# Patient Record
Sex: Female | Born: 1990 | Race: White | Hispanic: No | Smoking: Current every day smoker
Health system: Southern US, Community
[De-identification: ages and names within clinical notes are randomized; demographics above are authoritative.]

---

## 2016-12-06 ENCOUNTER — Emergency Department (HOSPITAL_COMMUNITY)
Admission: EM | Admit: 2016-12-06 | Discharge: 2016-12-06 | Disposition: A | Discharge status: Home / Self Care | Payer: Commercial Managed Care - PPO | Attending: Emergency Medicine | Admitting: Emergency Medicine

## 2016-12-06 ENCOUNTER — Encounter (HOSPITAL_COMMUNITY): Payer: Self-pay | Admitting: Emergency Medicine

## 2016-12-06 DIAGNOSIS — R1013 Epigastric pain: Secondary | ICD-10-CM | POA: Insufficient documentation

## 2016-12-06 DIAGNOSIS — F17210 Nicotine dependence, cigarettes, uncomplicated: Secondary | ICD-10-CM | POA: Insufficient documentation

## 2016-12-06 DIAGNOSIS — R112 Nausea with vomiting, unspecified: Secondary | ICD-10-CM | POA: Diagnosis not present

## 2016-12-06 LAB — URINALYSIS, ROUTINE W REFLEX MICROSCOPIC
Bilirubin Urine: NEGATIVE
GLUCOSE, UA: NEGATIVE mg/dL
HGB URINE DIPSTICK: NEGATIVE
Ketones, ur: NEGATIVE mg/dL
LEUKOCYTES UA: NEGATIVE
Nitrite: NEGATIVE
PROTEIN: NEGATIVE mg/dL
SPECIFIC GRAVITY, URINE: 1.019 (ref 1.005–1.030)
pH: 7 (ref 5.0–8.0)

## 2016-12-06 LAB — CBC WITH DIFFERENTIAL/PLATELET
BASOS PCT: 1 %
Basophils Absolute: 0 10*3/uL (ref 0.0–0.1)
Eosinophils Absolute: 0.1 10*3/uL (ref 0.0–0.7)
Eosinophils Relative: 2 %
HEMATOCRIT: 37.8 % (ref 36.0–46.0)
HEMOGLOBIN: 12.8 g/dL (ref 12.0–15.0)
LYMPHS PCT: 33 %
Lymphs Abs: 1.9 10*3/uL (ref 0.7–4.0)
MCH: 29.6 pg (ref 26.0–34.0)
MCHC: 33.9 g/dL (ref 30.0–36.0)
MCV: 87.5 fL (ref 78.0–100.0)
MONO ABS: 0.4 10*3/uL (ref 0.1–1.0)
Monocytes Relative: 7 %
NEUTROS ABS: 3.3 10*3/uL (ref 1.7–7.7)
NEUTROS PCT: 57 %
Platelets: 251 10*3/uL (ref 150–400)
RBC: 4.32 MIL/uL (ref 3.87–5.11)
RDW: 13 % (ref 11.5–15.5)
WBC: 5.7 10*3/uL (ref 4.0–10.5)

## 2016-12-06 LAB — COMPREHENSIVE METABOLIC PANEL
ALBUMIN: 4.2 g/dL (ref 3.5–5.0)
ALT: 19 U/L (ref 14–54)
AST: 18 U/L (ref 15–41)
Alkaline Phosphatase: 35 U/L — ABNORMAL LOW (ref 38–126)
Anion gap: 6 (ref 5–15)
BILIRUBIN TOTAL: 0.2 mg/dL — AB (ref 0.3–1.2)
BUN: 16 mg/dL (ref 6–20)
CALCIUM: 9.3 mg/dL (ref 8.9–10.3)
CO2: 25 mmol/L (ref 22–32)
CREATININE: 0.53 mg/dL (ref 0.44–1.00)
Chloride: 108 mmol/L (ref 101–111)
GFR calc Af Amer: >60 mL/min (ref >60)
GLUCOSE: 101 mg/dL — AB (ref 65–99)
Potassium: 4.1 mmol/L (ref 3.5–5.1)
Sodium: 139 mmol/L (ref 135–145)
TOTAL PROTEIN: 6.8 g/dL (ref 6.5–8.1)

## 2016-12-06 LAB — LIPASE, BLOOD: LIPASE: 23 U/L (ref 11–51)

## 2016-12-06 MED ORDER — SUCRALFATE 1 G PO TABS: 1.0000 g | ORAL_TABLET | Freq: Three times a day (TID) | ORAL | 0 refills | Status: DC

## 2016-12-06 MED ORDER — ONDANSETRON HCL 4 MG/2ML IJ SOLN
4.0000 mg | Freq: Once | INTRAMUSCULAR | Status: AC
  Administered 2016-12-06: 4 mg via INTRAVENOUS
  Filled 2016-12-06: qty 2

## 2016-12-06 MED ORDER — DICYCLOMINE HCL 10 MG PO CAPS
10.0000 mg | ORAL_CAPSULE | Freq: Once | ORAL | Status: AC
  Administered 2016-12-06: 10 mg via ORAL
  Filled 2016-12-06: qty 1

## 2016-12-06 MED ORDER — GI COCKTAIL ~~LOC~~
30.0000 mL | Freq: Once | ORAL | Status: AC
  Administered 2016-12-06: 30 mL via ORAL
  Filled 2016-12-06: qty 30

## 2016-12-06 MED ORDER — SODIUM CHLORIDE 0.9 % IV BOLUS (SEPSIS)
1000.0000 mL | Freq: Once | INTRAVENOUS | Status: AC
  Administered 2016-12-06: 1000 mL via INTRAVENOUS

## 2016-12-06 NOTE — ED Triage Notes (Signed)
Pt states that she has been having abdominal pain that goes up into her chest and is a burning sharp pain.  Pt states that she has also been nauseous and vomited once.  She has taken Zantac with no relief.

## 2016-12-06 NOTE — ED Notes (Signed)
-   PREGNANCY TEST NEGATIVE

## 2016-12-06 NOTE — Discharge Instructions (Addendum)
Follow-up with referred GI doctor. Call their office and arrange for an appointment to be seen next week.  Take medication as instructed for symptomatic relief.  Make sure to eat bland foods that will not irritate your reflux.  If you do not have a primary care doctor you can use the following list to obtain one.  [facility]  [facility]     [employer]                          [facility] [address] [place]                       [address]      [address]  [place]                                                [place]                            [place] Phone:  [phone]                                   Phone:  [phone]                 Phone:  [phone]  [facility] Phone:  [phone]  [facility] Hotline [phone]) [phone] (After Hours)

## 2016-12-06 NOTE — ED Provider Notes (Signed)
[facility]-EMERGENCY DEPT Provider Note   CSN: [id number] Arrival date & time: 12/06/16  0851     History   Chief Complaint Chief Complaint  Patient presents with  . Abdominal Pain    HPI Cindy Spears is a 26 y.o. female who presents with epigastric abdominal pain that began this morning at 4 AM. Patient describes pain as a "burning that goes up into my chest and throat." She states that pain is worsened by laying supine and improved if she is up. She took one Zantac this morning with no relief of symptoms. She states she has a history of indigestion but has not been formally diagnosed with GERD and is on any daily medication. She reports that last night she ate various by seafood and had a lot of caffeine which usually contributes to her indigestion. She reports associated nausea and vomiting began this morning. She reports 1 episode of nonbloody nonbilious emesis since onset of symptoms. Her last bowel movement was this morning and was normal with no blood. She is a current cigarette smoker and smokes half a pack of cigarettes a day. She reports that she only drinks alcohol about once a week. She denies any fevers/chills, chest pain, difficulty breathing, diarrhea or constipation, dysuria or hematuria.  The history is provided by the patient.    History reviewed. No pertinent past medical history.  There are no active problems to display for this patient.   History reviewed. No pertinent surgical history.  OB History    No data available       Home Medications    Prior to Admission medications   Medication Sig Start Date End Date Taking? Authorizing Provider  sucralfate (CARAFATE) 1 g tablet Take 1 tablet (1 g total) by mouth 4 (four) times daily -  with meals and at bedtime. 12/06/16   Maxwell Caul, PA-C    Family History No family history on file.  Social History Social History  Substance Use Topics  . Smoking status: Current Some Day Smoker    Packs/day: 0.50   Years: 1.00    Types: Cigarettes  . Smokeless tobacco: Never Used  . Alcohol use Yes     Comment: occasional     Allergies   Patient has no known allergies.   Review of Systems Review of Systems  Constitutional: Negative for chills and fever.  HENT: Negative for sore throat.   Respiratory: Negative for cough and shortness of breath.   Cardiovascular: Negative for chest pain.  Gastrointestinal: Positive for abdominal pain, nausea and vomiting. Negative for blood in stool and diarrhea.  Genitourinary: Negative for dysuria and hematuria.  Skin: Negative for rash.  Neurological: Negative for dizziness, weakness and numbness.  All other systems reviewed and are negative.    Physical Exam Updated Vital Signs BP 102/60   Pulse 87   Resp 13   Ht 5' (1.524 m)   Wt 48.5 kg   LMP 11/20/2016 (Approximate)   SpO2 91%   BMI 20.90 kg/m   Physical Exam  Constitutional: She is oriented to person, place, and time. She appears well-developed and well-nourished.  Sitting complaining on examination table  HENT:  Head: Normocephalic and atraumatic.  Mouth/Throat: Oropharynx is clear and moist and mucous membranes are normal.  Eyes: Conjunctivae, EOM and lids are normal. Pupils are equal, round, and reactive to light.  Neck: Full passive range of motion without pain.  Cardiovascular: Normal rate, regular rhythm, normal heart sounds and normal pulses.  Exam reveals no  gallop and no friction rub.   No murmur heard. Pulmonary/Chest: Effort normal and breath sounds normal.  Abdominal: Soft. Normal appearance and bowel sounds are normal. There is tenderness in the epigastric area. There is no rigidity, no rebound and no guarding.  Abdomen is soft, nondistended. Mild epigastric tenderness to palpation. No peritoneal signs.  Musculoskeletal: Normal range of motion.  Neurological: She is alert and oriented to person, place, and time.  Skin: Skin is warm and dry. Capillary refill takes less than  2 seconds.  Psychiatric: She has a normal mood and affect. Her speech is normal.  Nursing note and vitals reviewed.    ED Treatments / Results  Labs (all labs ordered are listed, but only abnormal results are displayed) Labs Reviewed  URINALYSIS, ROUTINE W REFLEX MICROSCOPIC - Abnormal; Notable for the following:       Result Value   APPearance CLOUDY (*)    All other components within normal limits  COMPREHENSIVE METABOLIC PANEL - Abnormal; Notable for the following:    Glucose, Bld 101 (*)    Alkaline Phosphatase 35 (*)    Total Bilirubin 0.2 (*)    All other components within normal limits  LIPASE, BLOOD  CBC WITH DIFFERENTIAL/PLATELET  POC URINE PREG, ED    EKG  EKG Interpretation None       Radiology No results found.  Procedures Procedures (including critical care time)  Medications Ordered in ED Medications  sodium chloride 0.9 % bolus 1,000 mL (0 mLs Intravenous Stopped 12/06/16 1050)  ondansetron (ZOFRAN) injection 4 mg (4 mg Intravenous Given 12/06/16 0950)  gi cocktail (Maalox,Lidocaine,Donnatal) (30 mLs Oral Given 12/06/16 0950)  dicyclomine (BENTYL) capsule 10 mg (10 mg Oral Given 12/06/16 1143)     Initial Impression / Assessment and Plan / ED Course  I have reviewed the triage vital signs and the nursing notes.  Pertinent labs & imaging results that were available during my care of the patient were reviewed by me and considered in my medical decision making (see chart for details).     26 year old female presents with epigastric abdominal pain she describes as a "burning sensation that goes up her chest into her throat." No formal diagnosis of GERD but states that she has indigestion issues on this time. Prior to onset of symptoms she states that she ate very spicy meal and had a lot of caffeine, which usually upsets her indigestion. No relief with Zantac. Associated with 1 episode of vomiting. Patient is afebrile, non-toxic appearing, sitting  comfortably on examination table. Physical exam shows mild tenderness to palpation to epigastric region. No peritoneal signs. Otherwise unremarkable physical exam. Consider GERD versus viral GI. Low suspicion for ACS given history/physical exam. Will give GI cocktail and zofran for symptomatic relief. Plan to check basic labs including CBC, CMP, UA, Urine pregnancy, EKG. IVF given for fluid resuscitation.  10:03 AM: Patient reports improvement in pain after GI cocktail. Labs pending.   Labs reviewed. CMP and CBC within normal limits. No elevated white blood cell count. Lipase within normal limits. Urinalysis reviewed negative for any leukocytes or nitrates. Urine pregnancy negative. She has tolerated by mouth while in the department and has not had any more nausea/vomiting.  Reevaluation: Patient reports that pain has improved after Bentyl. Repeat exam shows patient is stable for discharge at this time. Will plan to send her home with Carafate. Will also provide patient with GI referral to follow-up for formal evaluation of reflux. Strict return precautions given. Patient expresses understanding  and agreement to plan.   Final Clinical Impressions(s) / ED Diagnoses   Final diagnoses:  Epigastric pain    New Prescriptions Discharge Medication List as of 12/06/2016 11:39 AM    START taking these medications   Details  sucralfate (CARAFATE) 1 g tablet Take 1 tablet (1 g total) by mouth 4 (four) times daily -  with meals and at bedtime., Starting Sat 12/06/2016, Print         Maxwell Caul, PA-C 12/06/16 1645    Mesner, Barbara Cower, MD 12/07/16 [phone]

## 2017-12-25 ENCOUNTER — Emergency Department (HOSPITAL_COMMUNITY)
Admission: EM | Admit: 2017-12-25 | Discharge: 2017-12-25 | Disposition: A | Discharge status: Home / Self Care | Payer: Medicaid Other | Attending: Emergency Medicine | Admitting: Emergency Medicine

## 2017-12-25 ENCOUNTER — Other Ambulatory Visit: Payer: Self-pay

## 2017-12-25 ENCOUNTER — Encounter (HOSPITAL_COMMUNITY): Payer: Self-pay | Admitting: Emergency Medicine

## 2017-12-25 ENCOUNTER — Emergency Department (HOSPITAL_COMMUNITY): Payer: Medicaid Other

## 2017-12-25 DIAGNOSIS — S91114A Laceration without foreign body of right lesser toe(s) without damage to nail, initial encounter: Secondary | ICD-10-CM | POA: Insufficient documentation

## 2017-12-25 DIAGNOSIS — Y9301 Activity, walking, marching and hiking: Secondary | ICD-10-CM | POA: Insufficient documentation

## 2017-12-25 DIAGNOSIS — Y999 Unspecified external cause status: Secondary | ICD-10-CM | POA: Insufficient documentation

## 2017-12-25 DIAGNOSIS — Y92513 Shop (commercial) as the place of occurrence of the external cause: Secondary | ICD-10-CM | POA: Insufficient documentation

## 2017-12-25 DIAGNOSIS — F17210 Nicotine dependence, cigarettes, uncomplicated: Secondary | ICD-10-CM | POA: Diagnosis not present

## 2017-12-25 DIAGNOSIS — W208XXA Other cause of strike by thrown, projected or falling object, initial encounter: Secondary | ICD-10-CM | POA: Insufficient documentation

## 2017-12-25 MED ORDER — LIDOCAINE-EPINEPHRINE (PF) 2 %-1:200000 IJ SOLN
20.0000 mL | Freq: Once | INTRAMUSCULAR | Status: AC
  Administered 2017-12-25: 20 mL
  Filled 2017-12-25: qty 20

## 2017-12-25 NOTE — ED Provider Notes (Signed)
[facility]-EMERGENCY DEPT Provider Note   CSN: [id number] Arrival date & time: 12/25/17  2021     History   Chief Complaint Chief Complaint  Patient presents with  . Extremity Laceration    HPI Cindy Spears is a 27 y.o. female.  27 yo F with a chief complaint of a right foot laceration.  The patient was walking into a small and her foot was struck by the door when she walked in.  Had some bleeding and came to the emergency department.  She denies other injury.  Has been able to ambulate.  The history is provided by the patient and the spouse.  Illness  This is a new problem. The current episode started 1 to 2 hours ago. The problem occurs constantly. The problem has not changed since onset.Pertinent negatives include no chest pain, no headaches and no shortness of breath. Nothing aggravates the symptoms. Nothing relieves the symptoms. She has tried nothing for the symptoms. The treatment provided no relief.    History reviewed. No pertinent past medical history.  There are no active problems to display for this patient.   History reviewed. No pertinent surgical history.   OB History   None      Home Medications    Prior to Admission medications   Medication Sig Start Date End Date Taking? Authorizing Provider  sucralfate (CARAFATE) 1 g tablet Take 1 tablet (1 g total) by mouth 4 (four) times daily -  with meals and at bedtime. 12/06/16   Maxwell Caul, PA-C    Family History History reviewed. No pertinent family history.  Social History Social History   Tobacco Use  . Smoking status: Current Some Day Smoker    Packs/day: 0.50    Years: 1.00    Pack years: 0.50    Types: Cigarettes  . Smokeless tobacco: Never Used  Substance Use Topics  . Alcohol use: Yes    Comment: occasional  . Drug use: No     Allergies   Patient has no known allergies.   Review of Systems Review of Systems  Constitutional: Negative for chills and  fever.  HENT: Negative for congestion and rhinorrhea.   Eyes: Negative for redness and visual disturbance.  Respiratory: Negative for shortness of breath and wheezing.   Cardiovascular: Negative for chest pain and palpitations.  Gastrointestinal: Negative for nausea and vomiting.  Genitourinary: Negative for dysuria and urgency.  Musculoskeletal: Negative for arthralgias and myalgias.  Skin: Positive for wound. Negative for pallor.  Neurological: Negative for dizziness and headaches.     Physical Exam Updated Vital Signs BP 119/83 (BP Location: Left Arm)   Pulse 75   Temp 98.4 F (36.9 C) (Oral)   Resp 18   Ht 5' (1.524 m)   Wt 51.4 kg (113 lb 4.8 oz)   LMP 11/06/2017   SpO2 100%   BMI 22.13 kg/m   Physical Exam  Constitutional: She is oriented to person, place, and time. She appears well-developed and well-nourished. No distress.  HENT:  Head: Normocephalic and atraumatic.  Eyes: Pupils are equal, round, and reactive to light. EOM are normal.  Neck: Normal range of motion. Neck supple.  Cardiovascular: Normal rate and regular rhythm. Exam reveals no gallop and no friction rub.  No murmur heard. Pulmonary/Chest: Effort normal. She has no wheezes. She has no rales.  Abdominal: Soft. She exhibits no distension. There is no tenderness.  Musculoskeletal: She exhibits no edema or tenderness.  Neurological: She is alert  and oriented to person, place, and time.  Skin: Skin is warm and dry. She is not diaphoretic.  2.7 cm laceration between the fourth and fifth digit of the right foot.  Psychiatric: She has a normal mood and affect. Her behavior is normal.  Nursing note and vitals reviewed.    ED Treatments / Results  Labs (all labs ordered are listed, but only abnormal results are displayed) Labs Reviewed - No data to display  EKG None  Radiology No results found.  Procedures .Marland KitchenLaceration Repair Date/Time: 12/25/2017 11:23 PM Performed by: Melene Plan,  DO Authorized by: Melene Plan, DO   Consent:    Consent obtained:  Verbal   Consent given by:  Patient   Risks discussed:  Infection, pain, poor cosmetic result and poor wound healing   Alternatives discussed:  No treatment, delayed treatment and observation Anesthesia (see MAR for exact dosages):    Anesthesia method:  Local infiltration   Local anesthetic:  Lidocaine 2% WITH epi Laceration details:    Location:  Toe   Toe location:  R little toe   Length (cm):  2.7 Repair type:    Repair type:  Intermediate Pre-procedure details:    Preparation:  Patient was prepped and draped in usual sterile fashion Exploration:    Wound exploration: entire depth of wound probed and visualized     Contaminated: no   Treatment:    Area cleansed with:  Saline   Amount of cleaning:  Extensive   Irrigation solution:  Sterile saline   Irrigation volume:  60   Irrigation method:  Pressure wash   Visualized foreign bodies/material removed: no   Skin repair:    Repair method:  Sutures   Suture size:  4-0   Suture material:  Nylon   Suture technique:  Simple interrupted   Number of sutures:  4 Approximation:    Approximation:  Close Post-procedure details:    Dressing:  Antibiotic ointment and bulky dressing   Patient tolerance of procedure:  Tolerated well, no immediate complications   (including critical care time)  Medications Ordered in ED Medications  lidocaine-EPINEPHrine (XYLOCAINE W/EPI) 2 %-1:200000 (PF) injection 20 mL (20 mLs Infiltration Given 12/25/17 2141)     Initial Impression / Assessment and Plan / ED Course  I have reviewed the triage vital signs and the nursing notes.  Pertinent labs & imaging results that were available during my care of the patient were reviewed by me and considered in my medical decision making (see chart for details).     27 yo F with a laceration between the fourth and fifth digit of the right foot.  This was sutured at bedside.  Discharge  home.  Tetanus up-to-date.  11:25 PM:  I have discussed the diagnosis/risks/treatment options with the patient and family and believe the pt to be eligible for discharge home to follow-up with PCP. We also discussed returning to the ED immediately if new or worsening sx occur. We discussed the sx which are most concerning (e.g., sudden worsening pain, fever, inability to tolerate by mouth) that necessitate immediate return. Medications administered to the patient during their visit and any new prescriptions provided to the patient are listed below.  Medications given during this visit Medications  lidocaine-EPINEPHrine (XYLOCAINE W/EPI) 2 %-1:200000 (PF) injection 20 mL (20 mLs Infiltration Given 12/25/17 2141)     The patient appears reasonably screen and/or stabilized for discharge and I doubt any other medical condition or other [facility] requiring further screening, evaluation, or  treatment in the ED at this time prior to discharge.    Final Clinical Impressions(s) / ED Diagnoses   Final diagnoses:  Laceration of lesser toe of right foot without foreign body present or damage to nail, initial encounter    ED Discharge Orders    None       Melene Plan, DO 12/25/17 2325

## 2017-12-25 NOTE — ED Triage Notes (Addendum)
Patient was opening a door at [place]illi[place] and the door sliced her right pinky toe. This happened about hour and half ago.

## 2018-05-11 ENCOUNTER — Emergency Department (HOSPITAL_COMMUNITY): Payer: Medicaid Other

## 2018-05-11 ENCOUNTER — Other Ambulatory Visit: Payer: Self-pay

## 2018-05-11 ENCOUNTER — Encounter (HOSPITAL_COMMUNITY): Payer: Self-pay

## 2018-05-11 ENCOUNTER — Emergency Department (HOSPITAL_COMMUNITY)
Admission: EM | Admit: 2018-05-11 | Discharge: 2018-05-11 | Disposition: A | Discharge status: Home / Self Care | Payer: Medicaid Other | Attending: Emergency Medicine | Admitting: Emergency Medicine

## 2018-05-11 DIAGNOSIS — Y92410 Unspecified street and highway as the place of occurrence of the external cause: Secondary | ICD-10-CM | POA: Diagnosis not present

## 2018-05-11 DIAGNOSIS — Y9389 Activity, other specified: Secondary | ICD-10-CM | POA: Diagnosis not present

## 2018-05-11 DIAGNOSIS — S299XXA Unspecified injury of thorax, initial encounter: Secondary | ICD-10-CM | POA: Diagnosis present

## 2018-05-11 DIAGNOSIS — Z79899 Other long term (current) drug therapy: Secondary | ICD-10-CM | POA: Diagnosis not present

## 2018-05-11 DIAGNOSIS — S29012A Strain of muscle and tendon of back wall of thorax, initial encounter: Secondary | ICD-10-CM | POA: Insufficient documentation

## 2018-05-11 DIAGNOSIS — F17210 Nicotine dependence, cigarettes, uncomplicated: Secondary | ICD-10-CM | POA: Insufficient documentation

## 2018-05-11 DIAGNOSIS — Y998 Other external cause status: Secondary | ICD-10-CM | POA: Insufficient documentation

## 2018-05-11 LAB — POC URINE PREG, ED: PREG TEST UR: NEGATIVE
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
## 2018-05-11 NOTE — ED Notes (Signed)
Patient given discharge instruction, verbalized understand. Patient ambulatory out of the department.  

## 2018-09-07 ENCOUNTER — Emergency Department (HOSPITAL_COMMUNITY)
Admission: EM | Admit: 2018-09-07 | Discharge: 2018-09-08 | Disposition: A | Discharge status: Home / Self Care | Payer: Medicaid Other | Attending: Emergency Medicine | Admitting: Emergency Medicine

## 2018-09-07 ENCOUNTER — Encounter (HOSPITAL_COMMUNITY): Payer: Self-pay | Admitting: Emergency Medicine

## 2018-09-07 ENCOUNTER — Other Ambulatory Visit: Payer: Self-pay

## 2018-09-07 DIAGNOSIS — O219 Vomiting of pregnancy, unspecified: Secondary | ICD-10-CM | POA: Diagnosis present

## 2018-09-07 DIAGNOSIS — O99619 Diseases of the digestive system complicating pregnancy, unspecified trimester: Secondary | ICD-10-CM | POA: Insufficient documentation

## 2018-09-07 DIAGNOSIS — Z3A00 Weeks of gestation of pregnancy not specified: Secondary | ICD-10-CM | POA: Insufficient documentation

## 2018-09-07 DIAGNOSIS — Z79899 Other long term (current) drug therapy: Secondary | ICD-10-CM | POA: Diagnosis not present

## 2018-09-07 DIAGNOSIS — K219 Gastro-esophageal reflux disease without esophagitis: Secondary | ICD-10-CM | POA: Diagnosis not present

## 2018-09-07 DIAGNOSIS — Z87891 Personal history of nicotine dependence: Secondary | ICD-10-CM | POA: Diagnosis not present

## 2018-09-07 DIAGNOSIS — R1013 Epigastric pain: Secondary | ICD-10-CM

## 2018-09-07 MED ORDER — ALUM & MAG HYDROXIDE-SIMETH 200-200-20 MG/5ML PO SUSP
30.0000 mL | Freq: Once | ORAL | Status: AC
  Administered 2018-09-08: 30 mL via ORAL
  Filled 2018-09-07: qty 30

## 2018-09-07 MED ORDER — FAMOTIDINE 20 MG PO TABS
20.0000 mg | ORAL_TABLET | Freq: Once | ORAL | Status: AC
  Administered 2018-09-08: 20 mg via ORAL
  Filled 2018-09-07: qty 1

## 2018-09-07 NOTE — ED Triage Notes (Signed)
Pt states she was diagnosed with flu x 4 days. Pt states today she started having chest pain and abd pain. Pt states she is about [redacted] weeks pregnant.

## 2018-09-08 MED ORDER — ONDANSETRON HCL 4 MG PO TABS: 4.0000 mg | ORAL_TABLET | Freq: Three times a day (TID) | ORAL | 0 refills | Status: DC | PRN

## 2018-09-08 NOTE — Discharge Instructions (Addendum)
Try to finish your Tamiflu.  Use the Zofran if needed for nausea.  Drink plenty of fluids.  You can take Tylenol/acetaminophen for body aches and fever.  Please avoid aspirin products including Goody's and ibuprofen/Advil/Motrin.  Continue your prenatal vitamins.  You can take Pepcid over-the-counter daily if needed for your gastritis/reflux symptoms.  Keep your prenatal appointment.  Recheck if you get lower abdominal pain or bleeding.

## 2018-09-08 NOTE — ED Provider Notes (Signed)
[facility] EMERGENCY DEPARTMENT Provider Note   CSN: [id number] Arrival date & time: 09/07/18  2129  Time seen 11:34 PM   History   Chief Complaint Chief Complaint  Patient presents with  . Chest Pain    HPI Cindy Spears is a 28 y.o. female.  HPI patient is G4, P2 Ab1, states her last normal period was December 26 but she had a light short period on January 13.  She just found out she was pregnant on February 9.  She states that evening she started having fever, body aches, and chills with a productive cough and rhinorrhea alternating with a stuffy nose.  She denies sneezing.  She went to be seen at [facility] yesterday and was diagnosed with the flu.  She was started on Tamiflu.  She states that 3 AM this morning she woke up with acute nausea and vomited once.  The day before she had one episode of posttussive vomiting.  She denies chest pain or getting acid fluid in her throat but she states she has a sharp burning pain in her epigastric area that started today.  She also has a burning and heaviness in her chest that radiates into her back between her shoulder blades.  She denies wheezing and states sometimes she feels a little short of breath but that is not a big complaint.  She states her cough seems worse today.  She denies dysuria or frequency but states her urinary output is less.  She states she thinks she had a fever this morning but it broke this afternoon.  She has not checked her temperature.  She states she also quit smoking the day she found out she was pregnant and was smoking 1 pack/day.  She is taking her prenatal vitamins.  She states she had reflux with her prior pregnancies.  She denies any lower abdominal pain or bleeding.  PCP Patient, No Pcp Per   History reviewed. No pertinent past medical history.  There are no active problems to display for this patient.   History reviewed. No pertinent surgical history.   OB History    Gravida  1   Para      Term      Preterm      AB      Living        SAB      TAB      Ectopic      Multiple      Live Births               Home Medications    Prior to Admission medications   Medication Sig Start Date End Date Taking? Authorizing Provider  guaiFENesin-codeine 100-10 MG/5ML syrup Take 5 mLs by mouth once as needed for cough.  09/23/17  Yes [provider]  Prenatal Vit-Fe Fumarate-FA (PRENATAL MULTIVITAMIN) TABS tablet Take 1 tablet by mouth daily at 12 noon.   Yes [provider]  ondansetron (ZOFRAN) 4 MG tablet Take 1 tablet (4 mg total) by mouth every 8 (eight) hours as needed. 09/08/18   Devoria Albe, MD    Family History History reviewed. No pertinent family history.  Social History Social History   Tobacco Use  . Smoking status: Former Smoker    Packs/day: 0.50    Years: 1.00    Pack years: 0.50    Types: Cigarettes    Last attempt to quit: 09/02/2018    Years since quitting: 0.0  . Smokeless tobacco: Never Used  Substance Use Topics  . Alcohol use: Not Currently    Comment: occasional  . Drug use: No  unemployed Was smoking 1 ppd   Allergies   Patient has no known allergies.   Review of Systems Review of Systems  All other systems reviewed and are negative.    Physical Exam Updated Vital Signs BP 106/73   Pulse 81   Temp 98.2 F (36.8 C)   Resp (!) 22   Ht 5' (1.524 m)   Wt 52.2 kg   LMP 07/22/2018   SpO2 99%   BMI 22.46 kg/m   Physical Exam Vitals signs and nursing note reviewed.  Constitutional:      General: She is not in acute distress.    Appearance: Normal appearance. She is well-developed. She is not ill-appearing or toxic-appearing.  HENT:     Head: Normocephalic and atraumatic.     Right Ear: External ear normal.     Left Ear: External ear normal.     Nose: Nose normal. No mucosal edema or rhinorrhea.     Mouth/Throat:     Dentition: No dental abscesses.     Pharynx: No uvula swelling.  Eyes:      Conjunctiva/sclera: Conjunctivae normal.     Pupils: Pupils are equal, round, and reactive to light.  Neck:     Musculoskeletal: Full passive range of motion without pain, normal range of motion and neck supple.  Cardiovascular:     Rate and Rhythm: Normal rate and regular rhythm.     Heart sounds: Normal heart sounds. No murmur. No friction rub. No gallop.   Pulmonary:     Effort: Pulmonary effort is normal. No respiratory distress.     Breath sounds: Normal breath sounds. No wheezing, rhonchi or rales.  Chest:     Chest wall: No tenderness or crepitus.  Abdominal:     General: Bowel sounds are normal. There is no distension.     Palpations: Abdomen is soft.     Tenderness: There is abdominal tenderness in the epigastric area. There is no guarding or rebound.  Musculoskeletal: Normal range of motion.        General: No tenderness.     Comments: Moves all extremities well.   Skin:    General: Skin is warm and dry.     Coloration: Skin is not pale.     Findings: No erythema or rash.  Neurological:     Mental Status: She is alert and oriented to person, place, and time.     Cranial Nerves: No cranial nerve deficit.  Psychiatric:        Mood and Affect: Mood is not anxious.        Speech: Speech normal.        Behavior: Behavior normal.      ED Treatments / Results  Labs (all labs ordered are listed, but only abnormal results are displayed) Labs Reviewed - No data to display  EKG EKG Interpretation  Date/Time:  Tuesday September 07 2018 21:57:43 EST Ventricular Rate:  82 PR Interval:  130 QRS Duration: 78 QT Interval:  362 QTC Calculation: 422 R Axis:   65 Text Interpretation:  Normal sinus rhythm Cannot rule out Anterior infarct , age undetermined No significant change since last tracing 06 Dec 2016 Confirmed by Devoria Albe ([id number]) on 09/07/2018 10:58:10 PM   Radiology No results found.  Procedures Procedures (including critical care time)  Medications Ordered in  ED Medications  alum & mag hydroxide-simeth (MAALOX/MYLANTA)  200-200-20 MG/5ML suspension 30 mL (30 mLs Oral Given 09/08/18 0013)  famotidine (PEPCID) tablet 20 mg (20 mg Oral Given 09/08/18 0013)     Initial Impression / Assessment and Plan / ED Course  I have reviewed the triage vital signs and the nursing notes.  Pertinent labs & imaging results that were available during my care of the patient were reviewed by me and considered in my medical decision making (see chart for details).     Patient was given Maalox and Pepcid.  At this point I am willing to add antibiotics without doing a chest x-ray.  She does not have a fever here and I do not hear any obvious rales on her exam.  Recheck at 1:15 AM patient states she is feeling better, the discomfort in her epigastric area is resolving.  She feels ready to go home.  She states she is had a lot of problems with reflux in the past.  Final Clinical Impressions(s) / ED Diagnoses   Final diagnoses:  Epigastric abdominal pain  Gastroesophageal reflux disease without esophagitis    ED Discharge Orders         Ordered    ondansetron (ZOFRAN) 4 MG tablet  Every 8 hours PRN     09/08/18 0120          Plan discharge  Devoria AlbeIva Mattix Imhof, MD, Concha PyoFACEP    Rhydian Baldi, MD 09/08/18 [phone]

## 2018-10-02 ENCOUNTER — Encounter (HOSPITAL_COMMUNITY): Payer: Self-pay | Admitting: Emergency Medicine

## 2018-10-02 ENCOUNTER — Other Ambulatory Visit: Payer: Self-pay

## 2018-10-02 ENCOUNTER — Emergency Department (HOSPITAL_COMMUNITY)
Admission: EM | Admit: 2018-10-02 | Discharge: 2018-10-02 | Disposition: A | Discharge status: Home / Self Care | Payer: Medicaid Other | Attending: Emergency Medicine | Admitting: Emergency Medicine

## 2018-10-02 ENCOUNTER — Emergency Department (HOSPITAL_COMMUNITY): Payer: Medicaid Other

## 2018-10-02 DIAGNOSIS — Z3A01 Less than 8 weeks gestation of pregnancy: Secondary | ICD-10-CM

## 2018-10-02 DIAGNOSIS — Z87891 Personal history of nicotine dependence: Secondary | ICD-10-CM | POA: Insufficient documentation

## 2018-10-02 DIAGNOSIS — R1011 Right upper quadrant pain: Secondary | ICD-10-CM | POA: Diagnosis not present

## 2018-10-02 DIAGNOSIS — O219 Vomiting of pregnancy, unspecified: Secondary | ICD-10-CM | POA: Diagnosis not present

## 2018-10-02 DIAGNOSIS — R112 Nausea with vomiting, unspecified: Secondary | ICD-10-CM

## 2018-10-02 DIAGNOSIS — R1013 Epigastric pain: Secondary | ICD-10-CM

## 2018-10-02 DIAGNOSIS — O26891 Other specified pregnancy related conditions, first trimester: Secondary | ICD-10-CM | POA: Diagnosis not present

## 2018-10-02 LAB — URINALYSIS, ROUTINE W REFLEX MICROSCOPIC
Bacteria, UA: NONE SEEN
Bilirubin Urine: NEGATIVE
Glucose, UA: NEGATIVE mg/dL
Hgb urine dipstick: NEGATIVE
KETONES UR: 5 mg/dL — AB
Leukocytes,Ua: NEGATIVE
Nitrite: NEGATIVE
Protein, ur: 30 mg/dL — AB
Specific Gravity, Urine: 1.026 (ref 1.005–1.030)
pH: 6 (ref 5.0–8.0)

## 2018-10-02 LAB — COMPREHENSIVE METABOLIC PANEL
ALT: 24 U/L (ref 0–44)
AST: 18 U/L (ref 15–41)
Albumin: 3.9 g/dL (ref 3.5–5.0)
Alkaline Phosphatase: 42 U/L (ref 38–126)
Anion gap: 8 (ref 5–15)
BUN: 15 mg/dL (ref 6–20)
CO2: 22 mmol/L (ref 22–32)
Calcium: 8.6 mg/dL — ABNORMAL LOW (ref 8.9–10.3)
Chloride: 105 mmol/L (ref 98–111)
Creatinine, Ser: 0.4 mg/dL — ABNORMAL LOW (ref 0.44–1.00)
GFR calc Af Amer: >60 mL/min (ref >60)
Glucose, Bld: 114 mg/dL — ABNORMAL HIGH (ref 70–99)
Potassium: 3.9 mmol/L (ref 3.5–5.1)
Sodium: 135 mmol/L (ref 135–145)
Total Bilirubin: 0.9 mg/dL (ref 0.3–1.2)
Total Protein: 6.7 g/dL (ref 6.5–8.1)

## 2018-10-02 LAB — LIPASE, BLOOD: LIPASE: 24 U/L (ref 11–51)

## 2018-10-02 LAB — CBC WITH DIFFERENTIAL/PLATELET
Abs Immature Granulocytes: 0.05 10*3/uL (ref 0.00–0.07)
BASOS PCT: 0 %
Basophils Absolute: 0 10*3/uL (ref 0.0–0.1)
Eosinophils Absolute: 0 10*3/uL (ref 0.0–0.5)
Eosinophils Relative: 0 %
HCT: 37.3 % (ref 36.0–46.0)
Hemoglobin: 12.1 g/dL (ref 12.0–15.0)
Immature Granulocytes: 1 %
Lymphocytes Relative: 6 %
Lymphs Abs: 0.6 10*3/uL — ABNORMAL LOW (ref 0.7–4.0)
MCH: 29.4 pg (ref 26.0–34.0)
MCHC: 32.4 g/dL (ref 30.0–36.0)
MCV: 90.5 fL (ref 80.0–100.0)
Monocytes Absolute: 0.5 10*3/uL (ref 0.1–1.0)
Monocytes Relative: 5 %
Neutro Abs: 9.2 10*3/uL — ABNORMAL HIGH (ref 1.7–7.7)
Neutrophils Relative %: 88 %
Platelets: 231 10*3/uL (ref 150–400)
RBC: 4.12 MIL/uL (ref 3.87–5.11)
RDW: 13.2 % (ref 11.5–15.5)
WBC: 10.3 10*3/uL (ref 4.0–10.5)
nRBC: 0 % (ref 0.0–0.2)

## 2018-10-02 MED ORDER — FAMOTIDINE IN NACL 20-0.9 MG/50ML-% IV SOLN
20.0000 mg | Freq: Once | INTRAVENOUS | Status: AC
  Administered 2018-10-02: 20 mg via INTRAVENOUS
  Filled 2018-10-02: qty 50

## 2018-10-02 MED ORDER — DIPHENHYDRAMINE HCL 50 MG/ML IJ SOLN
25.0000 mg | Freq: Once | INTRAMUSCULAR | Status: AC
  Administered 2018-10-02: 25 mg via INTRAVENOUS
  Filled 2018-10-02: qty 1

## 2018-10-02 MED ORDER — SODIUM CHLORIDE 0.9 % IV BOLUS
1000.0000 mL | Freq: Once | INTRAVENOUS | Status: AC
  Administered 2018-10-02: 1000 mL via INTRAVENOUS

## 2018-10-02 MED ORDER — DOXYLAMINE-PYRIDOXINE 10-10 MG PO TBEC: 2.0000 | DELAYED_RELEASE_TABLET | Freq: Every day | ORAL | 0 refills | Status: DC

## 2018-10-02 MED ORDER — METOCLOPRAMIDE HCL 5 MG/ML IJ SOLN
10.0000 mg | Freq: Once | INTRAMUSCULAR | Status: AC
  Administered 2018-10-02: 10 mg via INTRAVENOUS
  Filled 2018-10-02: qty 2

## 2018-10-02 MED ORDER — MORPHINE SULFATE (PF) 4 MG/ML IV SOLN
4.0000 mg | Freq: Once | INTRAVENOUS | Status: AC
  Administered 2018-10-02: 4 mg via INTRAVENOUS
  Filled 2018-10-02: qty 1

## 2018-10-02 MED ORDER — FAMOTIDINE 20 MG PO TABS: 20.0000 mg | ORAL_TABLET | Freq: Two times a day (BID) | ORAL | 0 refills | Status: AC | PRN

## 2018-10-02 MED ORDER — PROMETHAZINE HCL 25 MG/ML IJ SOLN
12.5000 mg | Freq: Once | INTRAMUSCULAR | Status: AC
  Administered 2018-10-02: 12.5 mg via INTRAVENOUS
  Filled 2018-10-02: qty 1

## 2018-10-02 NOTE — ED Provider Notes (Signed)
[facility] EMERGENCY DEPARTMENT Provider Note   CSN: [id number] Arrival date & time: 10/02/18  8413    History   Chief Complaint Chief Complaint  Patient presents with  . Abdominal Pain    HPI Cindy Spears is a 28 y.o. female, G3P2, presently [redacted] weeks gestation per US imaging from 4 days ago at [facility] presenting with a upper abdominal pain, n/v which started around 1 am today, reports frequent vomiting in addition to burning and sharp pain starting in her epigastric region, radiating to her bilateral upper abdomen, and down her "sides", she denies back, shoulder or flank pain and no chest pain.  She does have a history of acid reflux but denies increased symptoms recently.  she also denies fevers or chills, dysuria, hematuria, diarrhea, vaginal discharge or bleeding.  She last ate [place] food last night, family ate the same without similar sx.  She has been unable to tolerate any po intake and has had no medications prior to arrival.       HPI  History reviewed. No pertinent past medical history.  There are no active problems to display for this patient.   History reviewed. No pertinent surgical history.   OB History    Gravida  3   Para  2   Term      Preterm      AB      Living        SAB      TAB      Ectopic      Multiple      Live Births               Home Medications    Prior to Admission medications   Medication Sig Start Date End Date Taking? Authorizing Provider  Prenat w/o A-FeCbGl-DSS-FA-DHA (CITRANATAL ASSURE) 35-1 & 300 MG tablet Take 1 tablet by mouth daily. 09/29/18  Yes [provider]  Doxylamine-Pyridoxine (DICLEGIS) 10-10 MG TBEC Take 2 tablets by mouth at bedtime. 10/02/18   Burgess Amor, PA-C  famotidine (PEPCID) 20 MG tablet Take 1 tablet (20 mg total) by mouth 2 (two) times daily as needed for heartburn or indigestion. 10/02/18   Burgess Amor, PA-C    Family History History reviewed. No pertinent family  history.  Social History Social History   Tobacco Use  . Smoking status: Former Smoker    Packs/day: 0.50    Years: 1.00    Pack years: 0.50    Types: Cigarettes    Last attempt to quit: 09/02/2018    Years since quitting: 0.0  . Smokeless tobacco: Never Used  Substance Use Topics  . Alcohol use: Not Currently    Comment: occasional  . Drug use: No     Allergies   Patient has no known allergies.   Review of Systems Review of Systems  Constitutional: Negative for chills and fever.  HENT: Negative for congestion and sore throat.   Eyes: Negative.   Respiratory: Negative for chest tightness and shortness of breath.   Cardiovascular: Negative for chest pain.  Gastrointestinal: Positive for abdominal pain, nausea and vomiting. Negative for diarrhea.  Genitourinary: Negative.  Negative for dysuria, flank pain, hematuria and pelvic pain.  Musculoskeletal: Negative for arthralgias, joint swelling and neck pain.  Skin: Negative.  Negative for rash and wound.  Neurological: Negative for dizziness, weakness, light-headedness, numbness and headaches.  Psychiatric/Behavioral: Negative.      Physical Exam Updated Vital Signs BP 105/65   Pulse 88  Temp 98.3 F (36.8 C) (Oral)   Resp (!) 22   Ht 5' (1.524 m)   Wt 52 kg   LMP 07/22/2018   SpO2 100%   BMI 22.39 kg/m   Physical Exam Vitals signs and nursing note reviewed.  Constitutional:      Appearance: She is well-developed.     Comments: Actively dry heaving during initial presentation, bilious fluid in emesis bag.  HENT:     Head: Normocephalic and atraumatic.  Eyes:     Conjunctiva/sclera: Conjunctivae normal.  Neck:     Musculoskeletal: Normal range of motion.  Cardiovascular:     Rate and Rhythm: Normal rate and regular rhythm.     Heart sounds: Normal heart sounds.  Pulmonary:     Effort: Pulmonary effort is normal.     Breath sounds: Normal breath sounds. No wheezing.  Abdominal:     General: Bowel  sounds are normal.     Palpations: Abdomen is soft.     Tenderness: There is abdominal tenderness in the right upper quadrant, epigastric area and left upper quadrant. There is no guarding or rebound. Negative signs include Murphy's sign.  Musculoskeletal: Normal range of motion.  Skin:    General: Skin is warm and dry.  Neurological:     Mental Status: She is alert.      ED Treatments / Results  Labs (all labs ordered are listed, but only abnormal results are displayed) Labs Reviewed  COMPREHENSIVE METABOLIC PANEL - Abnormal; Notable for the following components:      Result Value   Glucose, Bld 114 (*)    Creatinine, Ser 0.40 (*)    Calcium 8.6 (*)    All other components within normal limits  CBC WITH DIFFERENTIAL/PLATELET - Abnormal; Notable for the following components:   Neutro Abs 9.2 (*)    Lymphs Abs 0.6 (*)    All other components within normal limits  URINALYSIS, ROUTINE W REFLEX MICROSCOPIC - Abnormal; Notable for the following components:   APPearance HAZY (*)    Ketones, ur 5 (*)    Protein, ur 30 (*)    All other components within normal limits  LIPASE, BLOOD    EKG None  Radiology US Abdomen Complete  Result Date: 10/02/2018 CLINICAL DATA:  Abdominal pain and vomiting EXAM: ABDOMEN ULTRASOUND COMPLETE COMPARISON:  None. FINDINGS: Gallbladder: No gallstones or wall thickening visualized. No sonographic Murphy sign noted by sonographer. Common bile duct: Diameter: 3.3 mm. Liver: No focal lesion identified. Within normal limits in parenchymal echogenicity. Portal vein is patent on color Doppler imaging with normal direction of blood flow towards the liver. IVC: No abnormality visualized. Pancreas: Visualized portion unremarkable. Spleen: Size and appearance within normal limits. Right Kidney: Length: 11.9 cm. Echogenicity within normal limits. No mass or hydronephrosis visualized. Left Kidney: Length: 10.8 cm. Echogenicity within normal limits. No mass or  hydronephrosis visualized. Abdominal aorta: No aneurysm visualized. Other findings: None. IMPRESSION: No acute abnormality noted. Electronically Signed   By: Alcide Clever M.D.   On: 10/02/2018 10:52    Procedures Procedures (including critical care time)  Medications Ordered in ED Medications  morphine 4 MG/ML injection 4 mg (4 mg Intravenous Given 10/02/18 0914)  metoCLOPramide (REGLAN) injection 10 mg (10 mg Intravenous Given 10/02/18 0916)  sodium chloride 0.9 % bolus 1,000 mL (0 mLs Intravenous Stopped 10/02/18 1026)  famotidine (PEPCID) IVPB 20 mg premix (0 mg Intravenous Stopped 10/02/18 1026)  promethazine (PHENERGAN) injection 12.5 mg (12.5 mg Intravenous Given 10/02/18 1210)  diphenhydrAMINE (BENADRYL) injection 25 mg (25 mg Intravenous Given 10/02/18 1214)  sodium chloride 0.9 % bolus 1,000 mL (0 mLs Intravenous Stopped 10/02/18 1423)     Initial Impression / Assessment and Plan / ED Course  I have reviewed the triage vital signs and the nursing notes.  Pertinent labs & imaging results that were available during my care of the patient were reviewed by me and considered in my medical decision making (see chart for details).        Pt received IV fluids along with a dose of Reglan and morphine, pepcid and she had complete resolution of her abdominal pain, and transient relief of her nausea.  Attempts at p.o. challenge resulted in further nausea and vomiting.  She was then given a dose of phenergan and benadryl with ability to tolerate Po fluids.    Labs are stable, abdominal ultrasound negative for acute pathology including gallbladder, pancreas or liver.  We obtained an ultrasound report from the pelvic ultrasound completed 4 days ago at [facility].  This reflects a single living IUP measuring 7 weeks 1 day with EDC of 05/16/2019.  Patient denies any lower abdominal or pelvic symptoms including no vaginal discharge or bleeding.  Prescribed diclegis, pepcid.  Advised recheck by her ob/gyn  if sx persist, return precautions outlined.    Final Clinical Impressions(s) / ED Diagnoses   Final diagnoses:  Epigastric pain  Nausea and vomiting, intractability of vomiting not specified, unspecified vomiting type  Less than [redacted] weeks gestation of pregnancy    ED Discharge Orders         Ordered    Doxylamine-Pyridoxine (DICLEGIS) 10-10 MG TBEC  Daily at bedtime     10/02/18 1405    famotidine (PEPCID) 20 MG tablet  2 times daily PRN     10/02/18 1405           Burgess Amor, PA-C 10/02/18 1713    Raeford Razor, MD 10/03/18 [phone]

## 2018-10-02 NOTE — ED Triage Notes (Signed)
Pt reports beginning to have upper abd pain radiating down both sides around 1am with no diarrhea, but constant vomiting.  Is about [redacted] wks pregnant and has had some nausea.

## 2018-10-02 NOTE — Discharge Instructions (Addendum)
Use the medications as prescribed.  If you continue to have nausea, vomiting or "morning sickness" beyond the next 2 days, you may add a morning dose of 1 tablet as well.  Plan to see your gynecologist for any persistent or worsening symptoms.

## 2019-07-07 IMAGING — DX DG THORACIC SPINE 3V
3 series · 3 of 3 positions shown · non-contrast
Comparison: 06/25/2016

CLINICAL DATA: Pt swerved to [place] this morning. Went into a
ditch and hit a bump. Was not very serious, but pt states her back
is hurting/smoker

EXAM:
THORACIC SPINE - 3 VIEWS

[t-spine ap]
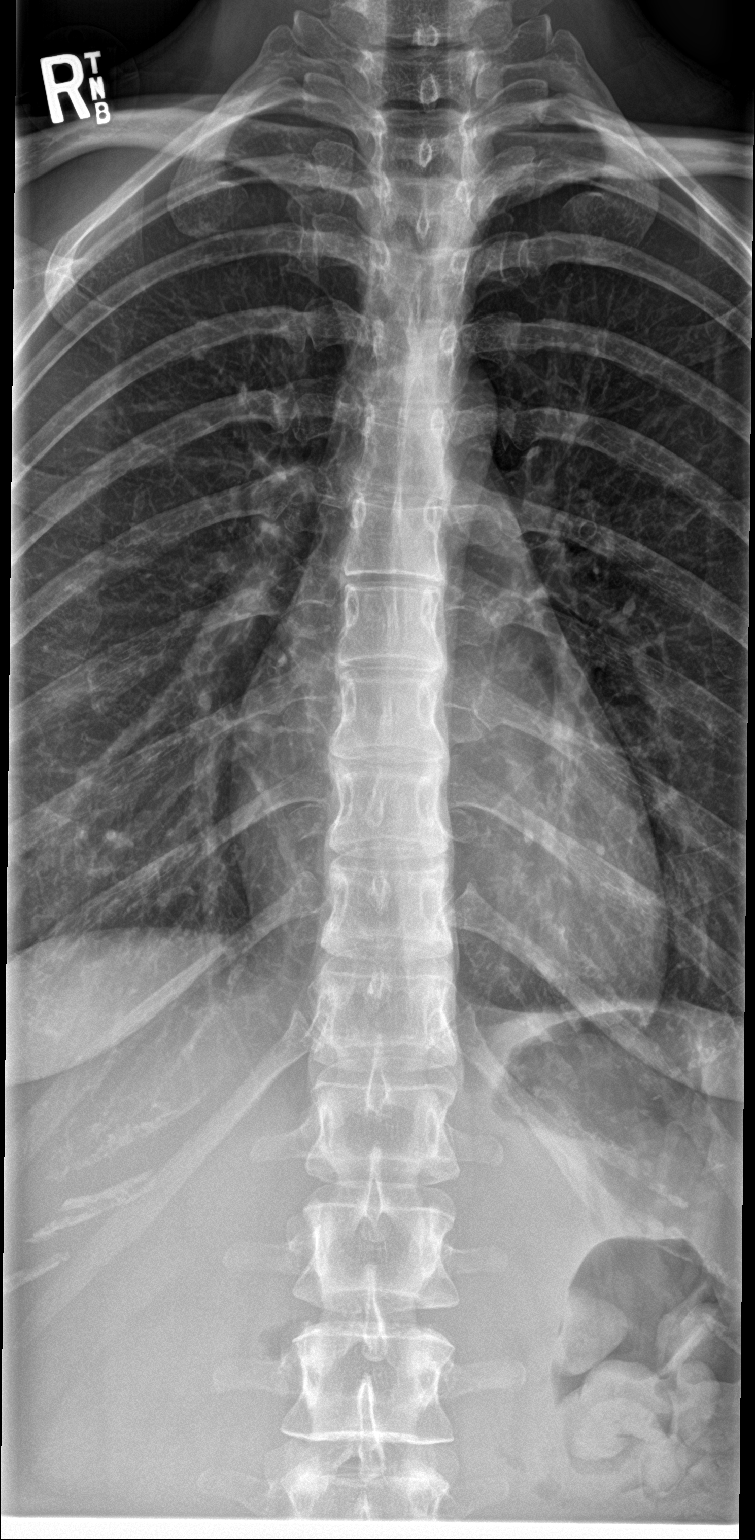

[t-spine lat]
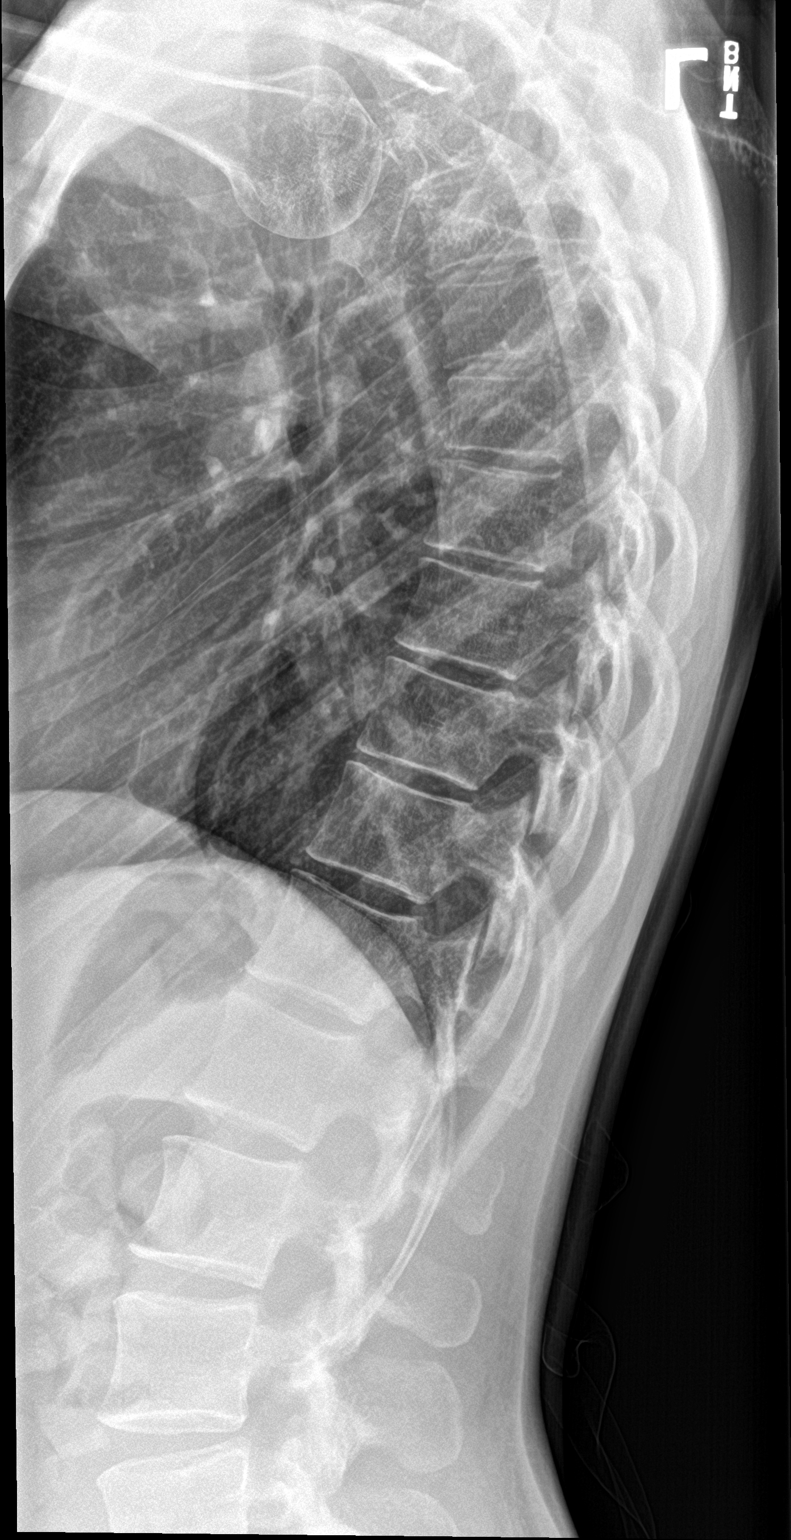

[t-spine swimmers]
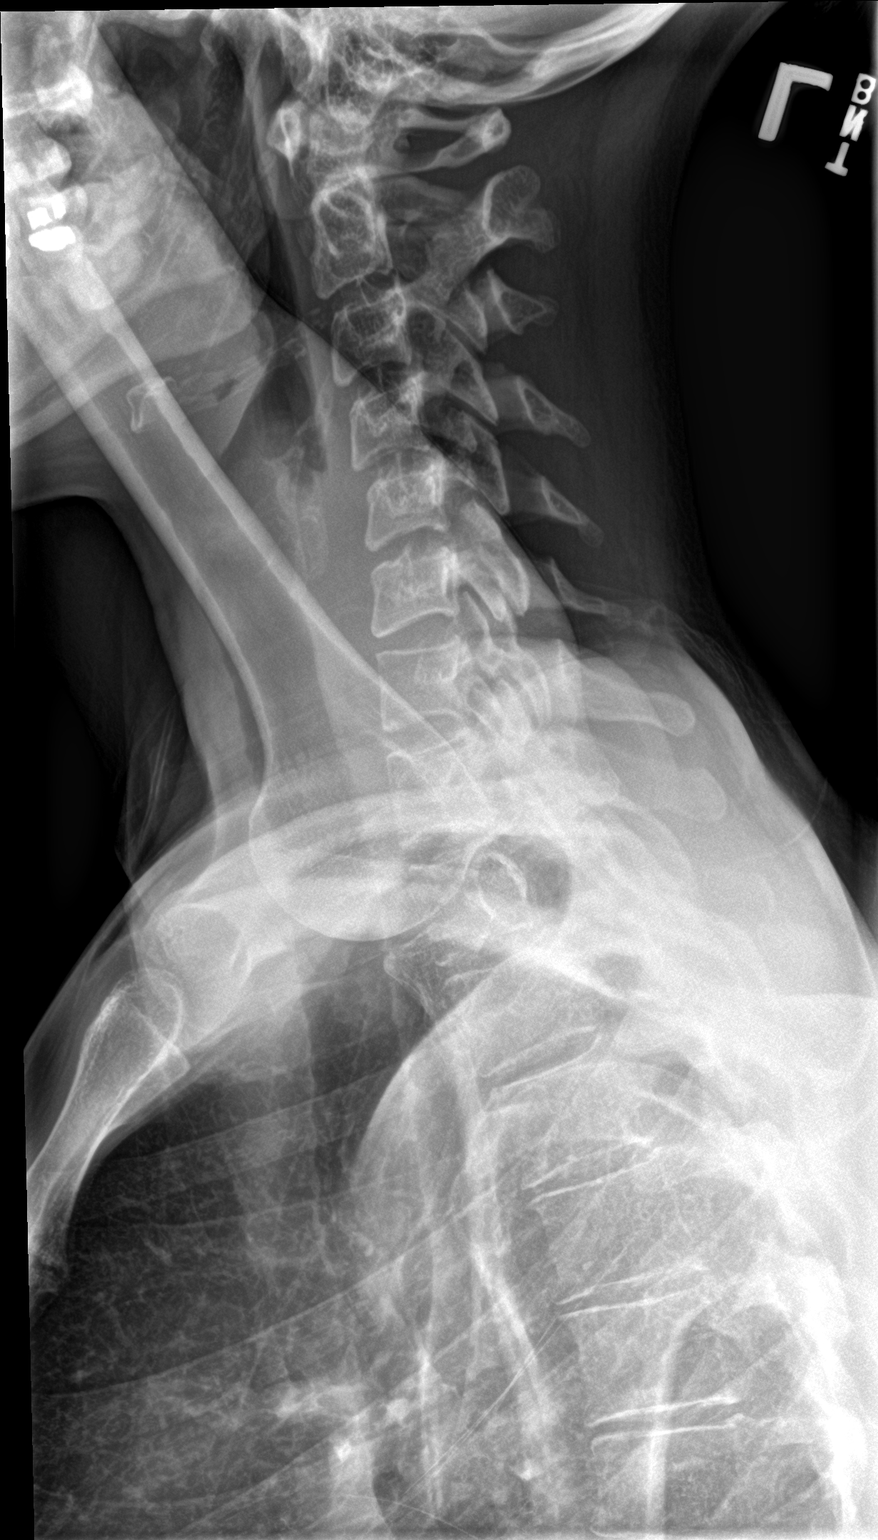

[3 of 3 positions shown; findings below may reference images not displayed]

FINDINGS: There is no evidence of thoracic spine fracture. Alignment is
normal. No other significant bone abnormalities are identified.
IMPRESSION: Negative.

## 2019-11-28 IMAGING — US US ABDOMEN COMPLETE
1 series · 14 of 25 positions shown · non-contrast
Comparison: None.

CLINICAL DATA: Abdominal pain and vomiting

EXAM:
ABDOMEN ULTRASOUND COMPLETE

[Series 1: us abdomen complete · 14 of 105 slices shown]
[im 1/105]
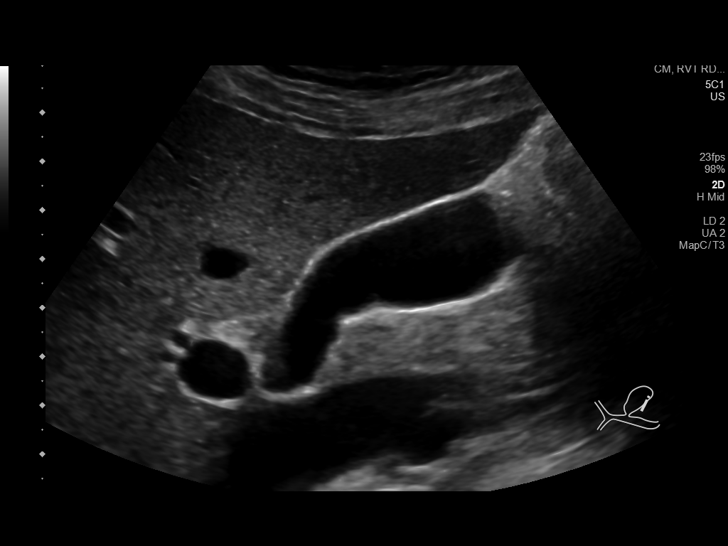
[im 9/105]
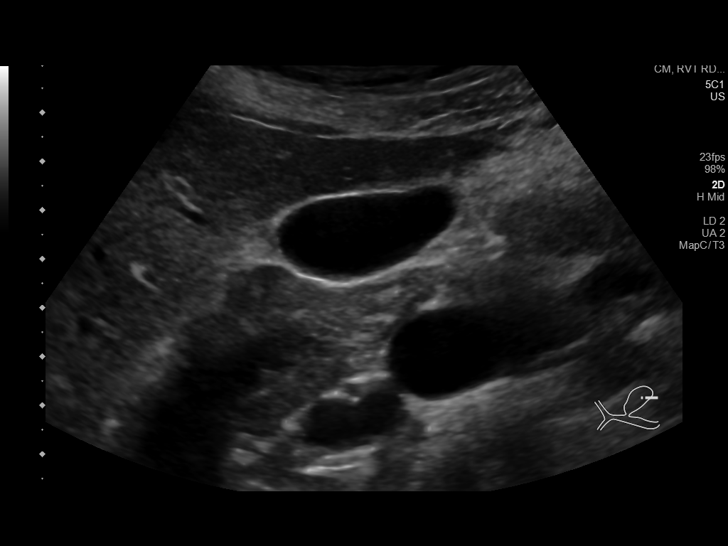
[im 18/105]
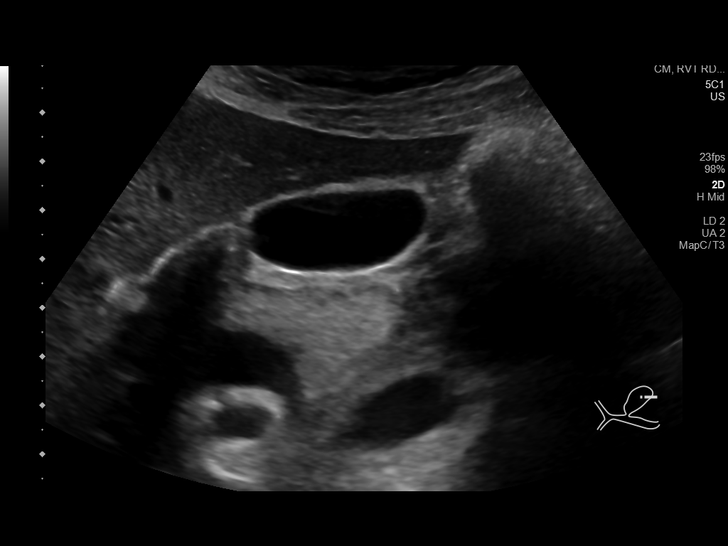
[im 27/105]
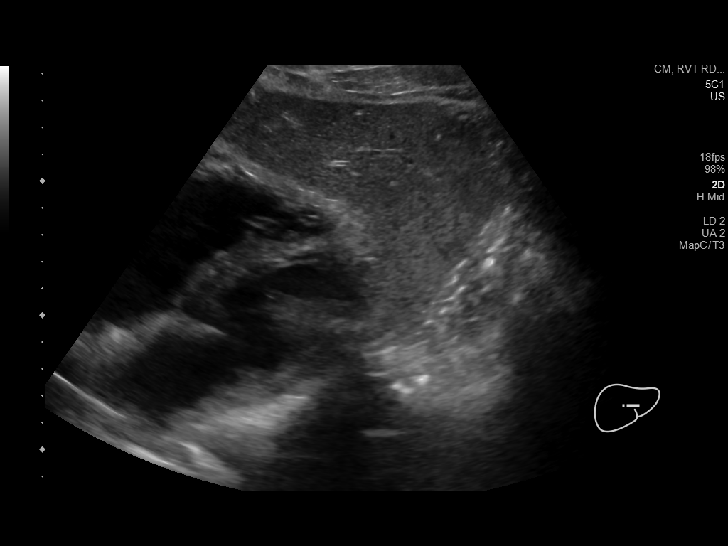
[im 35/105]
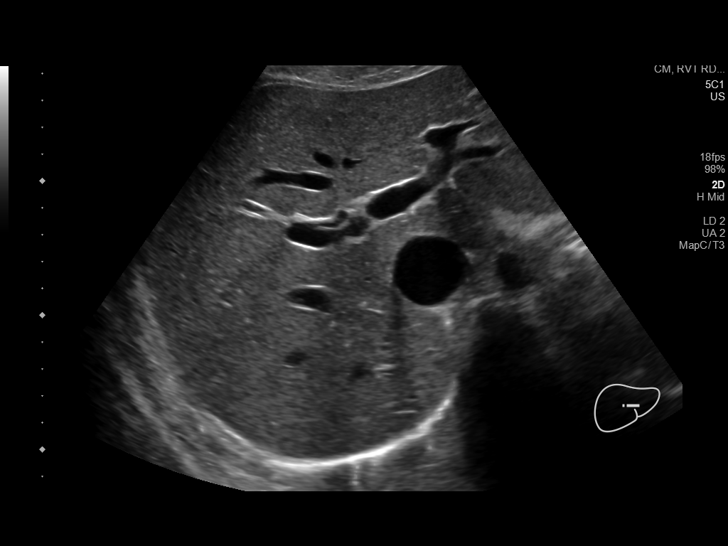
[im 40/105]
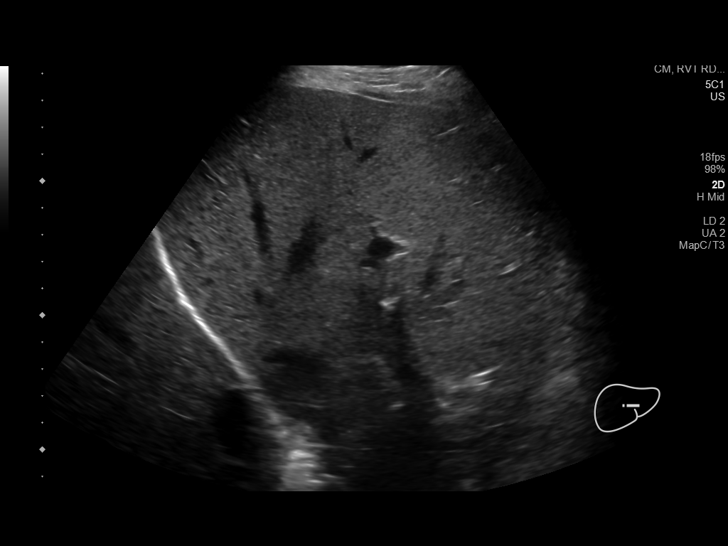
[im 48/105]
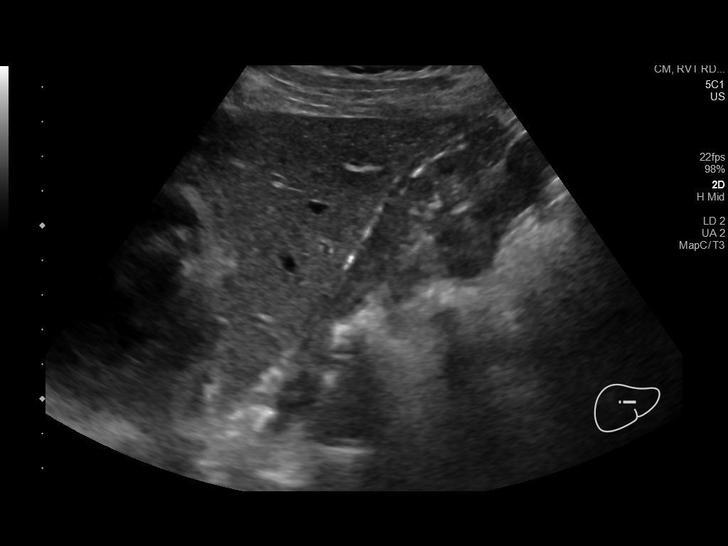
[im 57/105]
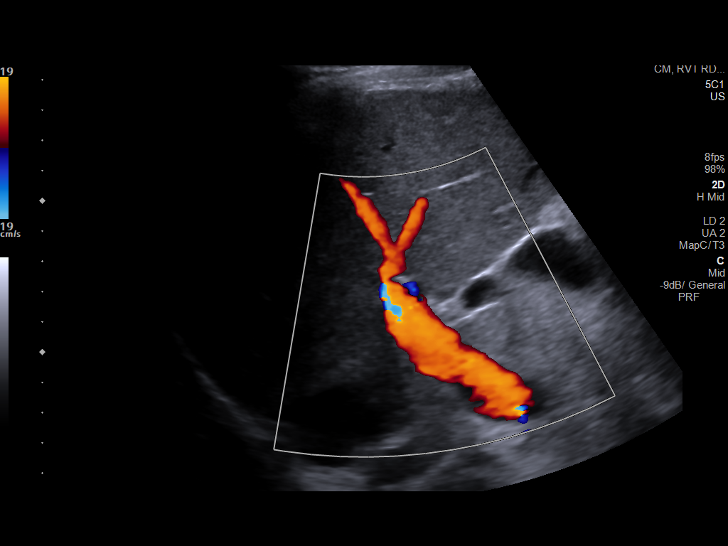
[im 66/105]
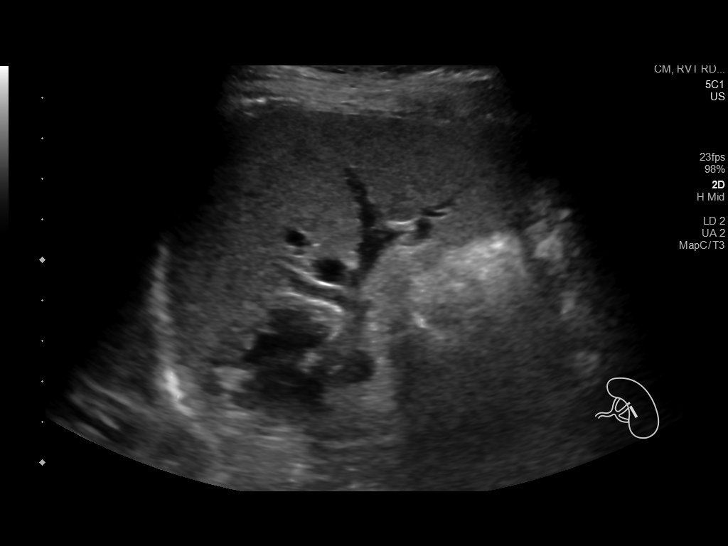
[im 70/105]
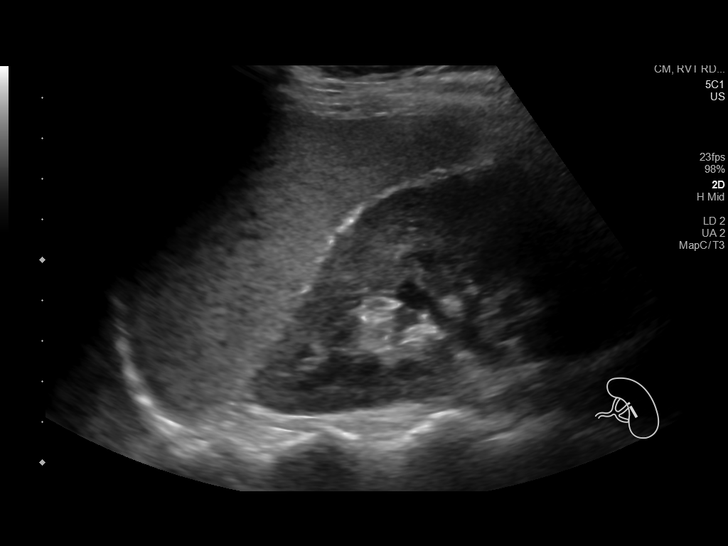
[im 79/105]
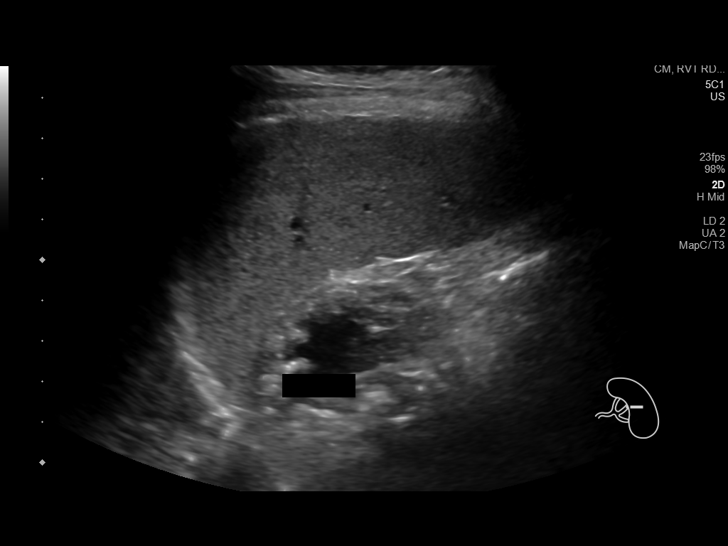
[im 87/105]
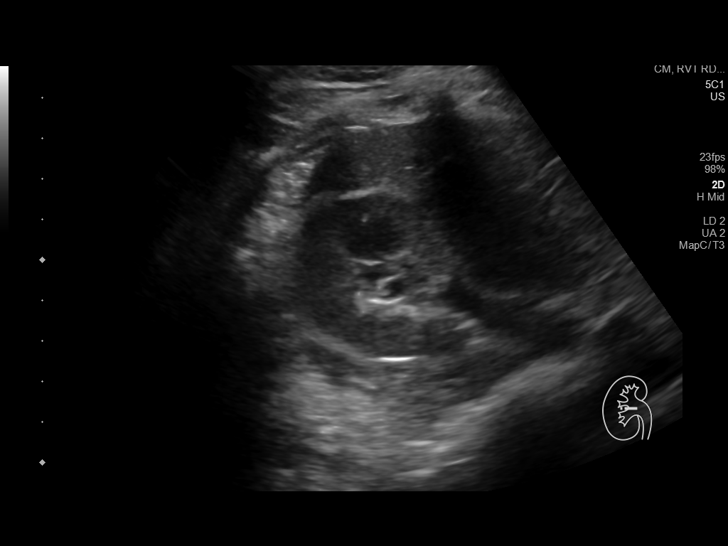
[im 96/105]
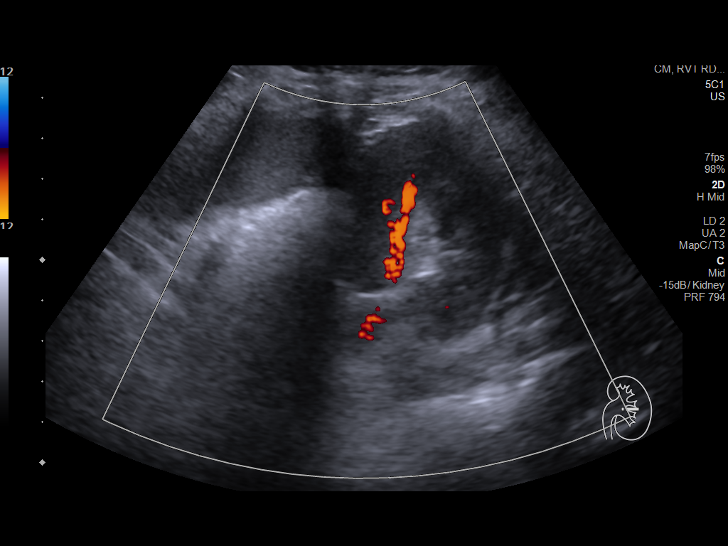
[im 105/105]
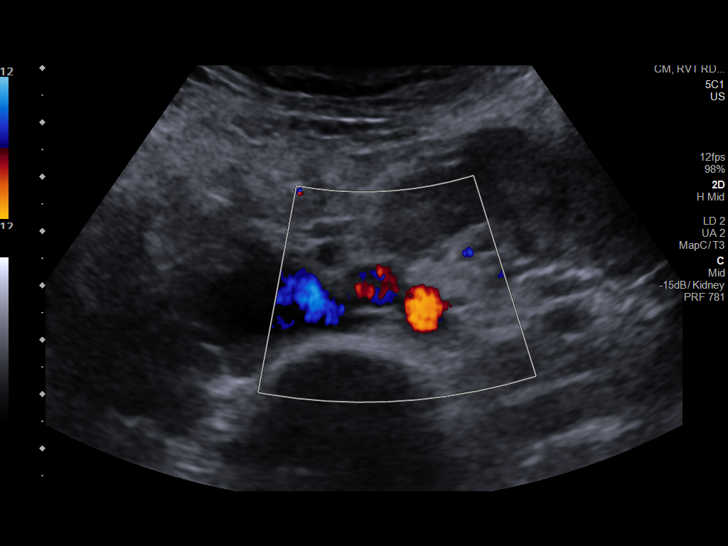

[14 of 25 positions shown; findings below may reference images not displayed]

FINDINGS: Gallbladder: No gallstones or wall thickening visualized. No
sonographic Murphy sign noted by sonographer.

Common bile duct: Diameter: 3.3 mm.

Liver: No focal lesion identified. Within normal limits in
parenchymal echogenicity. Portal vein is patent on color Doppler
imaging with normal direction of blood flow towards the liver.

IVC: No abnormality visualized.

Pancreas: Visualized portion unremarkable.

Spleen: Size and appearance within normal limits.

Right Kidney: Length: 11.9 cm. Echogenicity within normal limits. No
mass or hydronephrosis visualized.

Left Kidney: Length: 10.8 cm. Echogenicity within normal limits. No
mass or hydronephrosis visualized.

Abdominal aorta: No aneurysm visualized.

Other findings: None.
IMPRESSION: No acute abnormality noted.

## 2020-07-28 ENCOUNTER — Encounter (HOSPITAL_COMMUNITY): Payer: Self-pay | Admitting: Emergency Medicine

## 2020-07-28 ENCOUNTER — Other Ambulatory Visit: Payer: Self-pay

## 2020-07-28 ENCOUNTER — Emergency Department (HOSPITAL_COMMUNITY)
Admission: EM | Admit: 2020-07-28 | Discharge: 2020-07-28 | Disposition: A | Discharge status: Home / Self Care | Payer: Medicaid Other | Attending: Emergency Medicine | Admitting: Emergency Medicine

## 2020-07-28 DIAGNOSIS — Z87891 Personal history of nicotine dependence: Secondary | ICD-10-CM | POA: Diagnosis not present

## 2020-07-28 DIAGNOSIS — N61 Mastitis without abscess: Secondary | ICD-10-CM | POA: Insufficient documentation

## 2020-07-28 DIAGNOSIS — Z20822 Contact with and (suspected) exposure to covid-19: Secondary | ICD-10-CM | POA: Insufficient documentation

## 2020-07-28 DIAGNOSIS — N644 Mastodynia: Secondary | ICD-10-CM | POA: Diagnosis present

## 2020-07-28 LAB — SARS CORONAVIRUS 2 (TAT 6-24 HRS): SARS Coronavirus 2: NEGATIVE

## 2020-07-28 MED ORDER — CEPHALEXIN 500 MG PO CAPS
500.0000 mg | ORAL_CAPSULE | Freq: Once | ORAL | Status: AC
  Administered 2020-07-28: 500 mg via ORAL
  Filled 2020-07-28: qty 1

## 2020-07-28 MED ORDER — CEPHALEXIN 500 MG PO CAPS: 500.0000 mg | ORAL_CAPSULE | Freq: Four times a day (QID) | ORAL | 0 refills | Status: DC

## 2020-07-28 NOTE — ED Triage Notes (Signed)
Pt c/o fever prior to arrival. Pt concerned that she may have mastitis from breast feeding.

## 2020-07-28 NOTE — ED Provider Notes (Signed)
[facility] EMERGENCY DEPARTMENT Provider Note   CSN: [id number] Arrival date & time: 07/28/20  0037     History Chief Complaint - breast pain and fever  Cindy Spears is a 30 y.o. female.  The history is provided by the patient.  Patient presents with left breast pain and fever.  Patient reports she is still breast-feeding in her left breast.  She reports over the past day she had increasing pain and swelling the left breast.  She also reports fever over 100.  No cough or vomiting.  No dysuria.  No other acute complaints      PMH-none OB History    Gravida  3   Para  2   Term      Preterm      AB      Living        SAB      IAB      Ectopic      Multiple      Live Births              History reviewed. No pertinent family history.  Social History   Tobacco Use  . Smoking status: Former Smoker    Packs/day: 0.50    Years: 1.00    Pack years: 0.50    Types: Cigarettes    Quit date: 09/02/2018    Years since quitting: 1.9  . Smokeless tobacco: Never Used  Vaping Use  . Vaping Use: Former  Substance Use Topics  . Alcohol use: Not Currently    Comment: occasional  . Drug use: No    Home Medications Prior to Admission medications   Medication Sig Start Date End Date Taking? Authorizing Provider  cephALEXin (KEFLEX) 500 MG capsule Take 1 capsule (500 mg total) by mouth 4 (four) times daily. 07/28/20  Yes Zadie Rhine, MD  Doxylamine-Pyridoxine (DICLEGIS) 10-10 MG TBEC Take 2 tablets by mouth at bedtime. 10/02/18   Burgess Amor, PA-C  famotidine (PEPCID) 20 MG tablet Take 1 tablet (20 mg total) by mouth 2 (two) times daily as needed for heartburn or indigestion. 10/02/18   Burgess Amor, PA-C  Prenat w/o A-FeCbGl-DSS-FA-DHA (CITRANATAL ASSURE) 35-1 & 300 MG tablet Take 1 tablet by mouth daily. 09/29/18   [provider]    Allergies    Patient has no known allergies.  Review of Systems   Review of Systems  Constitutional: Positive for  fatigue and fever.  HENT: Negative for sore throat.   Respiratory: Negative for cough.   Gastrointestinal: Negative for vomiting.  Genitourinary: Negative for dysuria.    Physical Exam Updated Vital Signs BP 111/68 (BP Location: Right Arm)   Pulse (!) 116   Temp 99.5 F (37.5 C) (Oral)   Resp 18   Ht 1.524 m (5')   Wt 49.9 kg   SpO2 98%   BMI 21.48 kg/m   Physical Exam CONSTITUTIONAL: Well developed/well nourished HEAD: Normocephalic/atraumatic EYES: EOMI/PERRL ENMT:mask in place NECK: supple no meningeal signs SPINE/BACK:entire spine nontender CV: S1/S2 noted, no murmurs/rubs/gallops noted LUNGS: Lungs are clear to auscultation bilaterally, no apparent distress ABDOMEN: soft  NEURO: Pt is awake/alert/appropriate, moves all extremitiesx4.  No facial droop.   EXTREMITIES:   full ROM SKIN: warm, color normal Breast exam chaperoned by nurse Florentina Addison. Left breast has tenderness and induration on the lateral aspect.  Mild erythema.  No crepitus or drainage.  No nipple discharge.  No axillary lymphadenopathy.  There is no fluctuance to suggest abscess PSYCH: no abnormalities  of mood noted, alert and oriented to situation  ED Results / Procedures / Treatments   Labs (all labs ordered are listed, but only abnormal results are displayed) Labs Reviewed  SARS CORONAVIRUS 2 (TAT 6-24 HRS)    EKG None  Radiology No results found.  Procedures Procedures   Medications Ordered in ED Medications  cephALEXin (KEFLEX) capsule 500 mg (500 mg Oral Given 07/28/20 0230)    ED Course  I have reviewed the triage vital signs and the nursing notes.      MDM Rules/Calculators/A&P                          Patient likely has early mastitis.  Advised warm compresses, will start Keflex.  She is also requesting Covid test Final Clinical Impression(s) / ED Diagnoses Final diagnoses:  Mastitis    Rx / DC Orders ED Discharge Orders         Ordered    cephALEXin (KEFLEX) 500 MG  capsule  4 times daily        07/28/20 0225           Zadie Rhine, MD 07/28/20 [phone]

## 2020-10-09 ENCOUNTER — Other Ambulatory Visit: Payer: Self-pay

## 2020-10-09 ENCOUNTER — Encounter: Payer: Self-pay | Admitting: Emergency Medicine

## 2020-10-09 ENCOUNTER — Ambulatory Visit
Admission: EM | Admit: 2020-10-09 | Discharge: 2020-10-09 | Disposition: A | Discharge status: Home / Self Care | Payer: Medicaid Other | Attending: Emergency Medicine | Admitting: Emergency Medicine

## 2020-10-09 DIAGNOSIS — J0140 Acute pansinusitis, unspecified: Secondary | ICD-10-CM

## 2020-10-09 DIAGNOSIS — R0981 Nasal congestion: Secondary | ICD-10-CM | POA: Diagnosis not present

## 2020-10-09 MED ORDER — AMOXICILLIN-POT CLAVULANATE 875-125 MG PO TABS: 1.0000 | ORAL_TABLET | Freq: Two times a day (BID) | ORAL | 0 refills | Status: AC

## 2020-10-09 NOTE — ED Triage Notes (Signed)
Sinus pain and pressure x 3 days

## 2020-10-09 NOTE — ED Provider Notes (Signed)
[facility]   [id number] 10/09/20 Arrival Time: 7124   CC: sinus infection  SUBJECTIVE: History from: patient.  Cindy Spears is a 30 y.o. female who presents with sinus pain, pressure, and congestion x 3 days.  Denies sick exposure to COVID, flu or strep.  Has tried OTC medications without relief.  Symptoms are made worse with bending forward.  Reports previous symptoms in the past with sinus infection.   Denies fever, chills, SOB, wheezing, chest pain, nausea, changes in bowel or bladder habits.    ROS: As per HPI.  All other pertinent ROS negative.     History reviewed. No pertinent past medical history. History reviewed. No pertinent surgical history. No Known Allergies No current facility-administered medications on file prior to encounter.   Current Outpatient Medications on File Prior to Encounter  Medication Sig Dispense Refill  . Doxylamine-Pyridoxine (DICLEGIS) 10-10 MG TBEC Take 2 tablets by mouth at bedtime. 60 tablet 0  . famotidine (PEPCID) 20 MG tablet Take 1 tablet (20 mg total) by mouth 2 (two) times daily as needed for heartburn or indigestion. 30 tablet 0  . Prenat w/o A-FeCbGl-DSS-FA-DHA (CITRANATAL ASSURE) 35-1 & 300 MG tablet Take 1 tablet by mouth daily.     Social History   Socioeconomic History  . Marital status: Significant Other    Spouse name: Not on file  . Number of children: Not on file  . Years of education: Not on file  . Highest education level: Not on file  Occupational History  . Not on file  Tobacco Use  . Smoking status: Former Smoker    Packs/day: 0.50    Years: 1.00    Pack years: 0.50    Types: Cigarettes    Quit date: 09/02/2018    Years since quitting: 2.1  . Smokeless tobacco: Never Used  Vaping Use  . Vaping Use: Former  Substance and Sexual Activity  . Alcohol use: Not Currently    Comment: occasional  . Drug use: No  . Sexual activity: Not on file  Other Topics Concern  . Not on file  Social History  Narrative  . Not on file   Social Determinants of Health   Financial Resource Strain: Not on file  Food Insecurity: Not on file  Transportation Needs: Not on file  Physical Activity: Not on file  Stress: Not on file  Social Connections: Not on file  Intimate Partner Violence: Not on file   History reviewed. No pertinent family history.  OBJECTIVE:  Vitals:   10/09/20 0847  BP: 115/60  Pulse: 90  Resp: 16  Temp: 97.9 F (36.6 C)  TempSrc: Tympanic  SpO2: 98%     General appearance: alert; appears fatigued, but nontoxic; speaking in full sentences and tolerating own secretions HEENT: NCAT; Ears: EACs clear, TMs pearly gray; Eyes: PERRL.  EOM grossly intact. Sinuses: TTP; Nose: nares patent without rhinorrhea, Throat: oropharynx clear, tonsils non erythematous or enlarged, uvula midline  Neck: supple without LAD Lungs: unlabored respirations, symmetrical air entry; cough: absent; no respiratory distress; CTAB Heart: regular rate and rhythm.   Skin: warm and dry Psychological: alert and cooperative; normal mood and affect  ASSESSMENT & PLAN:  1. Sinus congestion   2. Acute non-recurrent pansinusitis     Meds ordered this encounter  Medications  . amoxicillin-clavulanate (AUGMENTIN) 875-125 MG tablet    Sig: Take 1 tablet by mouth every 12 (twelve) hours for 10 days.    Dispense:  20 tablet    Refill:  0    Order Specific Question:   Supervising Provider    Answer:   Eustace Moore [[id number]]   Discussed viral vs. Bacterial sinus infection.  Advised patient to wait 2-3 more days prior to taking antibiotic.    Declines covid test at this time Rest and push fluids Use OTC Zyrtec D daily for symptomatic relief Augmentin prescribed.  Take as directed and to completion Continue with OTC ibuprofen/tylenol as needed for pain Follow up with PCP as needed Return or go to the ED if you have any new or worsening symptoms such as fever, chills, worsening sinus  pain/pressure, cough, sore throat, chest pain, shortness of breath, abdominal pain, changes in bowel or bladder habits, etc...   Reviewed expectations re: course of current medical issues. Questions answered. Outlined signs and symptoms indicating need for more acute intervention. Patient verbalized understanding. After Visit Summary given.         Rennis Harding, PA-C 10/09/20 [phone]

## 2020-10-09 NOTE — Discharge Instructions (Signed)
Declines covid test at this time Rest and push fluids Use OTC Zyrtec D daily for symptomatic relief Augmentin prescribed.  Take as directed and to completion Continue with OTC ibuprofen/tylenol as needed for pain Follow up with PCP as needed Return or go to the ED if you have any new or worsening symptoms such as fever, chills, worsening sinus pain/pressure, cough, sore throat, chest pain, shortness of breath, abdominal pain, changes in bowel or bladder habits, etc..Marland Kitchen

## 2022-08-13 ENCOUNTER — Encounter: Payer: Self-pay | Admitting: Emergency Medicine

## 2022-08-13 ENCOUNTER — Ambulatory Visit
Admission: EM | Admit: 2022-08-13 | Discharge: 2022-08-13 | Disposition: A | Discharge status: Home / Self Care | Payer: Medicaid Other | Attending: Family Medicine | Admitting: Family Medicine

## 2022-08-13 DIAGNOSIS — F17210 Nicotine dependence, cigarettes, uncomplicated: Secondary | ICD-10-CM | POA: Insufficient documentation

## 2022-08-13 DIAGNOSIS — J069 Acute upper respiratory infection, unspecified: Secondary | ICD-10-CM | POA: Insufficient documentation

## 2022-08-13 DIAGNOSIS — Z1152 Encounter for screening for COVID-19: Secondary | ICD-10-CM | POA: Diagnosis not present

## 2022-08-13 DIAGNOSIS — R058 Other specified cough: Secondary | ICD-10-CM | POA: Insufficient documentation

## 2022-08-13 MED ORDER — OSELTAMIVIR PHOSPHATE 75 MG PO CAPS: 75.0000 mg | ORAL_CAPSULE | Freq: Two times a day (BID) | ORAL | 0 refills | Status: DC

## 2022-08-13 NOTE — ED Provider Notes (Signed)
[facility]    CSN: [id number] Arrival date & time: 08/13/22  1156      History   Chief Complaint No chief complaint on file.   HPI Cindy Spears is a 32 y.o. female.   Patient presenting today with resolving symptoms that started 2 weeks ago to include congestion, sore throat, cough, seem to get better and now since yesterday having low-grade fever, chills, body aches, headache, significant sore throat, congestion.  Denies chest pain, shortness of breath, abdominal pain, nausea vomiting or diarrhea.  Took some Tylenol cold and sinus with mild relief this morning.  Multiple sick contacts at home.   History reviewed. No pertinent past medical history.  There are no problems to display for this patient.  History reviewed. No pertinent surgical history.  OB History     Gravida  3   Para  2   Term      Preterm      AB      Living         SAB      IAB      Ectopic      Multiple      Live Births             Home Medications    Prior to Admission medications   Medication Sig Start Date End Date Taking? Authorizing Provider  oseltamivir (TAMIFLU) 75 MG capsule Take 1 capsule (75 mg total) by mouth every 12 (twelve) hours. 08/13/22  Yes Volney American, PA-C  Doxylamine-Pyridoxine (DICLEGIS) 10-10 MG TBEC Take 2 tablets by mouth at bedtime. 10/02/18   Evalee Jefferson, PA-C  famotidine (PEPCID) 20 MG tablet Take 1 tablet (20 mg total) by mouth 2 (two) times daily as needed for heartburn or indigestion. 10/02/18   Evalee Jefferson, PA-C  Prenat w/o A-FeCbGl-DSS-FA-DHA (CITRANATAL ASSURE) 35-1 & 300 MG tablet Take 1 tablet by mouth daily. 09/29/18   [provider]   Family History History reviewed. No pertinent family history.  Social History Social History   Tobacco Use   Smoking status: Every Day    Packs/day: 0.50    Years: 1.00    Total pack years: 0.50    Types: Cigarettes    Last attempt to quit: 09/02/2018    Years since quitting:  3.9   Smokeless tobacco: Never  Vaping Use   Vaping Use: Former  Substance Use Topics   Alcohol use: Not Currently    Comment: occasional   Drug use: No     Allergies   Patient has no known allergies.   Review of Systems Review of Systems PER HPI  Physical Exam Triage Vital Signs ED Triage Vitals  Enc Vitals Group     BP 08/13/22 1203 105/67     Pulse Rate 08/13/22 1203 89     Resp 08/13/22 1203 18     Temp 08/13/22 1203 98 F (36.7 C)     Temp Source 08/13/22 1203 Oral     SpO2 08/13/22 1203 98 %     Weight --      Height --      Head Circumference --      Peak Flow --      Pain Score 08/13/22 1204 3     Pain Loc --      Pain Edu? --      Excl. in [place]? --    No data found.  Updated Vital Signs BP 105/67 (BP Location: Right Arm)  Pulse 89   Temp 98 F (36.7 C) (Oral)   Resp 18   LMP 07/29/2022 (Exact Date)   SpO2 98%   Visual Acuity Right Eye Distance:   Left Eye Distance:   Bilateral Distance:    Right Eye Near:   Left Eye Near:    Bilateral Near:     Physical Exam Vitals and nursing note reviewed.  Constitutional:      Appearance: Normal appearance.  HENT:     Head: Atraumatic.     Right Ear: Tympanic membrane and external ear normal.     Left Ear: Tympanic membrane and external ear normal.     Nose: Rhinorrhea present.     Mouth/Throat:     Mouth: Mucous membranes are moist.     Pharynx: Posterior oropharyngeal erythema present.  Eyes:     Extraocular Movements: Extraocular movements intact.     Conjunctiva/sclera: Conjunctivae normal.  Cardiovascular:     Rate and Rhythm: Normal rate and regular rhythm.     Heart sounds: Normal heart sounds.  Pulmonary:     Effort: Pulmonary effort is normal.     Breath sounds: Normal breath sounds. No wheezing or rales.  Musculoskeletal:        General: Normal range of motion.     Cervical back: Normal range of motion and neck supple.  Skin:    General: Skin is warm and dry.  Neurological:      Mental Status: She is alert and oriented to person, place, and time.  Psychiatric:        Mood and Affect: Mood normal.        Thought Content: Thought content normal.      UC Treatments / Results  Labs (all labs ordered are listed, but only abnormal results are displayed) Labs Reviewed  SARS CORONAVIRUS 2 (TAT 6-24 HRS)    EKG   Radiology No results found.  Procedures Procedures (including critical care time)  Medications Ordered in UC Medications - No data to display  Initial Impression / Assessment and Plan / UC Course  I have reviewed the triage vital signs and the nursing notes.  Pertinent labs & imaging results that were available during my care of the patient were reviewed by me and considered in my medical decision making (see chart for details).     Suspect 2 different viral illnesses causing her pattern of symptoms.  Highly likely that she now has influenza, but unable to test for this due to backorder on testing so COVID test pending for rule out.  Treat with Tamiflu, Flonase, cold and congestion medications and pain and fever reducers.  Supportive home care and return precautions reviewed.  No evidence of a secondary bacterial infection today.  Final Clinical Impressions(s) / UC Diagnoses   Final diagnoses:  Viral URI with cough     Discharge Instructions      Use Flonase twice daily, antihistamines, sinus rinses with warm saline, DayQuil, NyQuil.  We should have your COVID test back tomorrow and we will call if anything is positive.    ED Prescriptions     Medication Sig Dispense Auth. Provider   oseltamivir (TAMIFLU) 75 MG capsule Take 1 capsule (75 mg total) by mouth every 12 (twelve) hours. 10 capsule Volney American, [place]      PDMP not reviewed this encounter.   Volney American, [place] 08/13/22 1237

## 2022-08-13 NOTE — ED Triage Notes (Signed)
Headaches, chills, body aches, sore throat x 2 weeks.  Has been taking tylenol cold and flu

## 2022-08-13 NOTE — Discharge Instructions (Signed)
Use Flonase twice daily, antihistamines, sinus rinses with warm saline, DayQuil, NyQuil.  We should have your COVID test back tomorrow and we will call if anything is positive.

## 2022-08-14 LAB — SARS CORONAVIRUS 2 (TAT 6-24 HRS): SARS Coronavirus 2: NEGATIVE

## 2022-10-22 ENCOUNTER — Emergency Department (HOSPITAL_COMMUNITY): Payer: Medicaid Other

## 2022-10-22 ENCOUNTER — Emergency Department (HOSPITAL_COMMUNITY)
Admission: EM | Admit: 2022-10-22 | Discharge: 2022-10-22 | Disposition: A | Discharge status: Home / Self Care | Payer: Medicaid Other | Attending: Emergency Medicine | Admitting: Emergency Medicine

## 2022-10-22 ENCOUNTER — Other Ambulatory Visit: Payer: Self-pay

## 2022-10-22 DIAGNOSIS — R519 Headache, unspecified: Secondary | ICD-10-CM | POA: Diagnosis not present

## 2022-10-22 LAB — I-STAT CHEM 8, ED
BUN: 14 mg/dL (ref 6–20)
Calcium, Ion: 1.28 mmol/L (ref 1.15–1.40)
Chloride: 102 mmol/L (ref 98–111)
Creatinine, Ser: 0.7 mg/dL (ref 0.44–1.00)
Glucose, Bld: 86 mg/dL (ref 70–99)
HCT: 38 % (ref 36.0–46.0)
Hemoglobin: 12.9 g/dL (ref 12.0–15.0)
Potassium: 3.9 mmol/L (ref 3.5–5.1)
Sodium: 141 mmol/L (ref 135–145)
TCO2: 27 mmol/L (ref 22–32)

## 2022-10-22 MED ORDER — KETOROLAC TROMETHAMINE 60 MG/2ML IM SOLN: 30.0000 mg | Freq: Once | INTRAMUSCULAR | Status: DC

## 2022-10-22 MED ORDER — KETOROLAC TROMETHAMINE 30 MG/ML IJ SOLN
15.0000 mg | Freq: Once | INTRAMUSCULAR | Status: AC
  Administered 2022-10-22: 15 mg via INTRAVENOUS
  Filled 2022-10-22: qty 1

## 2022-10-22 MED ORDER — IOHEXOL 350 MG/ML SOLN
75.0000 mL | Freq: Once | INTRAVENOUS | Status: AC | PRN
  Administered 2022-10-22: 75 mL via INTRAVENOUS

## 2022-10-22 NOTE — ED Notes (Signed)
ED Provider at bedside. 

## 2022-10-22 NOTE — ED Provider Notes (Signed)
[facility] Provider Note   CSN: [id number] Arrival date & time: 10/22/22  0256     History  Chief Complaint  Patient presents with   Headache    Cindy Spears is a 32 y.o. female.  Patient presents with a headache.  Patient does not normally get headaches and if she does not usually gets better with over-the-counter medications.  She has had a daily headache for about a week.  She states it is mostly on the right side.  It comes and goes in intensity but seems to always be there.  Does seem to be worse at night.  Her family encouraged her to come get checked out.  No fever, neck stiffness, recent illnesses, trauma or other associated symptoms.  No confusion, vomiting.  She states she googled the possibilities and wants to make sure she does not have an aneurysm.   Headache      Home Medications Prior to Admission medications   Medication Sig Start Date End Date Taking? Authorizing Provider  Doxylamine-Pyridoxine (DICLEGIS) 10-10 MG TBEC Take 2 tablets by mouth at bedtime. 10/02/18   Evalee Jefferson, PA-C  famotidine (PEPCID) 20 MG tablet Take 1 tablet (20 mg total) by mouth 2 (two) times daily as needed for heartburn or indigestion. 10/02/18   Evalee Jefferson, PA-C  oseltamivir (TAMIFLU) 75 MG capsule Take 1 capsule (75 mg total) by mouth every 12 (twelve) hours. 08/13/22   Volney American, PA-C  Prenat w/o A-FeCbGl-DSS-FA-DHA (CITRANATAL ASSURE) 35-1 & 300 MG tablet Take 1 tablet by mouth daily. 09/29/18   [provider]      Allergies    Patient has no known allergies.    Review of Systems   Review of Systems  Neurological:  Positive for headaches.    Physical Exam Updated Vital Signs BP 117/69   Pulse 64   Temp 98.2 F (36.8 C) (Oral)   Resp 18   Ht 5' (1.524 m)   Wt 59 kg   LMP 10/16/2022 (Exact Date)   SpO2 99%   BMI 25.39 kg/m  Physical Exam Vitals and nursing note reviewed.  Constitutional:       Appearance: She is well-developed.  HENT:     Head: Normocephalic and atraumatic.  Cardiovascular:     Rate and Rhythm: Normal rate and regular rhythm.  Pulmonary:     Effort: No respiratory distress.     Breath sounds: No stridor.  Abdominal:     General: There is no distension.  Musculoskeletal:     Cervical back: Normal range of motion.  Neurological:     Mental Status: She is alert.     ED Results / Procedures / Treatments   Labs (all labs ordered are listed, but only abnormal results are displayed) Labs Reviewed  I-STAT CHEM 8, ED    EKG None  Radiology CT ANGIO HEAD NECK W WO CM  Result Date: 10/22/2022 CLINICAL DATA:  Right-sided headache for 1 week EXAM: CT ANGIOGRAPHY HEAD AND NECK TECHNIQUE: Multidetector CT imaging of the head and neck was performed using the standard protocol during bolus administration of intravenous contrast. Multiplanar CT image reconstructions and MIPs were obtained to evaluate the vascular anatomy. Carotid stenosis measurements (when applicable) are obtained utilizing NASCET criteria, using the distal internal carotid diameter as the denominator. RADIATION DOSE REDUCTION: This exam was performed according to the departmental dose-optimization program which includes automated exposure control, adjustment of the mA and/or kV according to patient  size and/or use of iterative reconstruction technique. CONTRAST:  65mL OMNIPAQUE IOHEXOL 350 MG/ML SOLN COMPARISON:  None Available. FINDINGS: CT HEAD FINDINGS Brain: No evidence of acute infarction, hemorrhage, hydrocephalus, extra-axial collection or mass lesion/mass effect. Vascular: See below Skull: Normal. Negative for fracture or focal lesion. Sinuses/Orbits: Unremarkable Review of the MIP images confirms the above findings CTA NECK FINDINGS Aortic arch: Normal Right carotid system: Normal Left carotid system: Normal Vertebral arteries: Normal vertebral and proximal subclavian arteries. Skeleton: No acute or  aggressive finding Other neck: No evidence of inflammation or mass. Retention cyst appearance in the left maxillary sinus. Upper chest: Clear apical lungs Review of the MIP images confirms the above findings CTA HEAD FINDINGS Anterior circulation: Vessels are smoothly contoured and widely patent. Negative for aneurysm or vascular malformation Posterior circulation: Vessels are smoothly contoured and widely patent. Negative for aneurysm or vascular malformation Venous sinuses: Unremarkable Anatomic variants: None significant Review of the MIP images confirms the above findings IMPRESSION: Normal CTA of the head and neck. Electronically Signed   By: Jorje Guild M.D.   On: 10/22/2022 04:14    Procedures Procedures    Medications Ordered in ED Medications  iohexol (OMNIPAQUE) 350 MG/ML injection 75 mL (75 mLs Intravenous Contrast Given 10/22/22 0357)  ketorolac (TORADOL) 30 MG/ML injection 15 mg (15 mg Intravenous Given 10/22/22 0442)    ED Course/ Medical Decision Making/ A&P                             Medical Decision Making Amount and/or Complexity of Data Reviewed Radiology: ordered.  Risk Prescription drug management.   CT done to evaluate for any intracranial abnormality since she has new or worsening headaches.  This was negative on my interpretation and radiology read reviewed as well.  Offered multiple options for headache treatment but she said on Toradol she has not wanted things can make her sleepy or any type of controlled substances.  She will follow with her PCP if not improving.   Final Clinical Impression(s) / ED Diagnoses Final diagnoses:  Acute nonintractable headache, unspecified headache type    Rx / DC Orders ED Discharge Orders     None         Nyimah Shadduck, Corene Cornea, MD 10/22/22 [phone]

## 2022-10-22 NOTE — ED Triage Notes (Signed)
Pt c/o of headache x1 week. States the pain is only on right side of head is tender to the touch. Pt has taken goody powers, tylenol, ibuprofen, as well as Claritin-D with no relief. Endorses photophobia. No hx of migraines

## 2022-10-22 NOTE — ED Notes (Signed)
Pt to CT

## 2023-01-15 ENCOUNTER — Ambulatory Visit
Admission: EM | Admit: 2023-01-15 | Discharge: 2023-01-15 | Disposition: A | Discharge status: Home / Self Care | Payer: Medicaid Other | Attending: Nurse Practitioner | Admitting: Nurse Practitioner

## 2023-01-15 DIAGNOSIS — W57XXXA Bitten or stung by nonvenomous insect and other nonvenomous arthropods, initial encounter: Secondary | ICD-10-CM

## 2023-01-15 DIAGNOSIS — S80861A Insect bite (nonvenomous), right lower leg, initial encounter: Secondary | ICD-10-CM | POA: Diagnosis not present

## 2023-01-15 MED ORDER — TRIAMCINOLONE ACETONIDE 0.1 % EX OINT: 1.0000 | TOPICAL_OINTMENT | Freq: Two times a day (BID) | CUTANEOUS | 0 refills | Status: AC

## 2023-01-15 NOTE — ED Triage Notes (Signed)
Pt states she was stung yesterday by a type of insect possible bee but not 100% sure but pt is now having redness to site on right lower calf with small blisters and itchy. Pt did take benadryl with no relief.

## 2023-01-15 NOTE — Discharge Instructions (Signed)
You are having an allergic reaction to the insect bite.  Please start taking an oral antihistamine 2 pills twice daily to help with the histamine response and itching.  You can continue Benadryl every 6 hours as needed for itching as well. Start cool compresses 15 minutes on, 45 minutes off every hour while awake.  I sent the topical ointment to the pharmacy to help with itching.

## 2023-01-16 NOTE — ED Provider Notes (Addendum)
[facility]    CSN: [id number] Arrival date & time: 01/15/23  1932      History   Chief Complaint Chief Complaint  Patient presents with   Insect Bite    HPI Cindy Spears is a 32 y.o. female.   Patient presents today for insect bite to right calf.  Reports she was stung by an insect yesterday but she was unable to completely see what type of insect it was.  She has never had a reaction to insect bites or stings before.  She reports the redness developed and was immediately and it was itchy.  Reports she took Benadryl and applied Benadryl cream last night.  As the day has gone on today, the redness and itching has worsened.  She also developed blisters inside of the red area that she became concerned about.  She denies fevers or nausea/vomiting.  No new muscle pain or joint aches.  No shortness of breath, throat or tongue swelling, or difficulty swallowing.    History reviewed. No pertinent past medical history.  There are no problems to display for this patient.   History reviewed. No pertinent surgical history.  OB History     Gravida  3   Para  2   Term      Preterm      AB      Living         SAB      IAB      Ectopic      Multiple      Live Births               Home Medications    Prior to Admission medications   Medication Sig Start Date End Date Taking? Authorizing Provider  triamcinolone ointment (KENALOG) 0.1 % Apply 1 Application topically 2 (two) times daily. 01/15/23  Yes Valentino Nose, NP  famotidine (PEPCID) 20 MG tablet Take 1 tablet (20 mg total) by mouth 2 (two) times daily as needed for heartburn or indigestion. 10/02/18   Burgess Amor, PA-C  Prenat w/o A-FeCbGl-DSS-FA-DHA (CITRANATAL ASSURE) 35-1 & 300 MG tablet Take 1 tablet by mouth daily. 09/29/18   [provider]    Family History History reviewed. No pertinent family history.  Social History Social History   Tobacco Use   Smoking status:  Every Day    Packs/day: 0.50    Years: 1.00    Additional pack years: 0.00    Total pack years: 0.50    Types: Cigarettes    Last attempt to quit: 09/02/2018    Years since quitting: 4.3   Smokeless tobacco: Never  Vaping Use   Vaping Use: Former  Substance Use Topics   Alcohol use: Not Currently    Comment: occasional   Drug use: No     Allergies   Prednisone   Review of Systems Review of Systems Per HPI  Physical Exam Triage Vital Signs ED Triage Vitals  Enc Vitals Group     BP 01/15/23 1940 116/80     Pulse Rate 01/15/23 1940 94     Resp 01/15/23 1940 18     Temp 01/15/23 1940 98 F (36.7 C)     Temp Source 01/15/23 1940 Oral     SpO2 01/15/23 1940 98 %     Weight --      Height --      Head Circumference --      Peak Flow --  Pain Score 01/15/23 1942 4     Pain Loc --      Pain Edu? --      Excl. in [place]? --    No data found.  Updated Vital Signs BP 116/80 (BP Location: Right Arm)   Pulse 94   Temp 98 F (36.7 C) (Oral)   Resp 18   LMP 01/09/2023 (Exact Date)   SpO2 98%   Visual Acuity Right Eye Distance:   Left Eye Distance:   Bilateral Distance:    Right Eye Near:   Left Eye Near:    Bilateral Near:     Physical Exam Vitals and nursing note reviewed.  Constitutional:      General: She is not in acute distress.    Appearance: Normal appearance. She is not toxic-appearing.  HENT:     Mouth/Throat:     Mouth: Mucous membranes are moist.     Pharynx: Oropharynx is clear.  Pulmonary:     Effort: Pulmonary effort is normal. No respiratory distress.  Skin:    General: Skin is warm and dry.     Capillary Refill: Capillary refill takes less than 2 seconds.     Findings: Erythema present.     Comments: Nontender, erythematous plaque to right calf; plaque measures approximately 4 cm x 6 cm.  Near the center of the plaque, there are numerous vesicles.  Patient is neurovascularly intact distal to the skin finding.  No pitting edema to the  right lower extremity.  Patient is full range of motion and full sensation to the right lower extremity.  Neurological:     Mental Status: She is alert and oriented to person, place, and time.  Psychiatric:        Behavior: Behavior is cooperative.      UC Treatments / Results  Labs (all labs ordered are listed, but only abnormal results are displayed) Labs Reviewed - No data to display  EKG   Radiology No results found.  Procedures Procedures (including critical care time)  Medications Ordered in UC Medications - No data to display  Initial Impression / Assessment and Plan / UC Course  I have reviewed the triage vital signs and the nursing notes.  Pertinent labs & imaging results that were available during my care of the patient were reviewed by me and considered in my medical decision making (see chart for details).   Patient is well-appearing, normotensive, afebrile, not tachycardic, not tachypneic, oxygenating well on room air.    1.Insect bite of right lower leg, initial encounter No red flags in history or on exam Suspect local allergic reaction Treat with oral antihistamine regimen, topical corticosteroids as needed for itching, and cool compresses Seek care for persistent/worsening symptoms despite treatment  The patient was given the opportunity to ask questions.  All questions answered to their satisfaction.  The patient is in agreement to this plan.    Final Clinical Impressions(s) / UC Diagnoses   Final diagnoses:  Insect bite of right lower leg, initial encounter     Discharge Instructions      You are having an allergic reaction to the insect bite.  Please start taking an oral antihistamine 2 pills twice daily to help with the histamine response and itching.  You can continue Benadryl every 6 hours as needed for itching as well. Start cool compresses 15 minutes on, 45 minutes off every hour while awake.  I sent the topical ointment to the pharmacy to  help with itching.  ED Prescriptions     Medication Sig Dispense Auth. Provider   triamcinolone ointment (KENALOG) 0.1 % Apply 1 Application topically 2 (two) times daily. 15 g Valentino Nose, NP      PDMP not reviewed this encounter.   Valentino Nose, NP 01/16/23 1213    Valentino Nose, NP 01/16/23 1213

## 2023-02-25 ENCOUNTER — Other Ambulatory Visit: Payer: Self-pay

## 2023-02-25 ENCOUNTER — Emergency Department (HOSPITAL_COMMUNITY)
Admission: EM | Admit: 2023-02-25 | Discharge: 2023-02-25 | Disposition: A | Discharge status: Home / Self Care | Payer: Medicaid Other | Attending: Emergency Medicine | Admitting: Emergency Medicine

## 2023-02-25 DIAGNOSIS — K0889 Other specified disorders of teeth and supporting structures: Secondary | ICD-10-CM | POA: Diagnosis present

## 2023-02-25 MED ORDER — AMOXICILLIN 500 MG PO CAPS: 500.0000 mg | ORAL_CAPSULE | Freq: Three times a day (TID) | ORAL | 0 refills | Status: AC

## 2023-02-25 MED ORDER — AMOXICILLIN 250 MG PO CAPS
1000.0000 mg | ORAL_CAPSULE | Freq: Once | ORAL | Status: AC
  Administered 2023-02-25: 1000 mg via ORAL
  Filled 2023-02-25: qty 4

## 2023-02-25 MED ORDER — HYDROCODONE-ACETAMINOPHEN 5-325 MG PO TABS: 1.0000 | ORAL_TABLET | ORAL | 0 refills | Status: AC | PRN

## 2023-02-25 NOTE — ED Provider Notes (Signed)
[place] [facility] AT [facility] Provider Note   CSN: [id number] Arrival date & time: 02/25/23  0106     History  Chief Complaint  Patient presents with   Dental Pain    Cindy Spears is a 32 y.o. female.  Presents to the emergency department for evaluation of dental pain.  Patient complaining of pain on the right upper side of her mouth.  Her dentist diagnosed her with dental abscess a little over a month ago.  She was to take a course of antibiotics and then have a dental procedure, but they could not get her scheduled for that.  She reports that the pain is starting to come back.  She is in a severe amount of pain.  She does have a follow-up appointment with a different dentist next week.  No fever, no facial swelling.       Home Medications Prior to Admission medications   Medication Sig Start Date End Date Taking? Authorizing Provider  amoxicillin (AMOXIL) 500 MG capsule Take 1 capsule (500 mg total) by mouth 3 (three) times daily. 02/25/23  Yes Petros Ahart, Canary Brim, MD  HYDROcodone-acetaminophen (NORCO/VICODIN) 5-325 MG tablet Take 1-2 tablets by mouth every 4 (four) hours as needed. 02/25/23  Yes Deziray Nabi, Canary Brim, MD  famotidine (PEPCID) 20 MG tablet Take 1 tablet (20 mg total) by mouth 2 (two) times daily as needed for heartburn or indigestion. 10/02/18   Burgess Amor, PA-C  Prenat w/o [id number] (CITRANATAL ASSURE) 35-1 & 300 MG tablet Take 1 tablet by mouth daily. 09/29/18   [provider]  triamcinolone ointment (KENALOG) 0.1 % Apply 1 Application topically 2 (two) times daily. 01/15/23   Valentino Nose, NP      Allergies    Prednisone    Review of Systems   Review of Systems  Physical Exam Updated Vital Signs BP 109/83   Pulse 79   Temp 98.2 F (36.8 C)   Resp 19   Ht 5' (1.524 m)   Wt 59 kg   LMP 01/24/2023 (Approximate)   SpO2 100%   BMI 25.40 kg/m  Physical Exam Vitals and nursing note reviewed.   Constitutional:      General: She is not in acute distress.    Appearance: She is well-developed.  HENT:     Head: Normocephalic and atraumatic.     Mouth/Throat:     Mouth: Mucous membranes are moist.  Eyes:     General: Vision grossly intact. Gaze aligned appropriately.     Extraocular Movements: Extraocular movements intact.     Conjunctiva/sclera: Conjunctivae normal.  Cardiovascular:     Rate and Rhythm: Normal rate and regular rhythm.     Pulses: Normal pulses.     Heart sounds: Normal heart sounds, S1 normal and S2 normal. No murmur heard.    No friction rub. No gallop.  Pulmonary:     Effort: Pulmonary effort is normal. No respiratory distress.     Breath sounds: Normal breath sounds.  Abdominal:     General: Bowel sounds are normal.     Palpations: Abdomen is soft.     Tenderness: There is no abdominal tenderness. There is no guarding or rebound.     Hernia: No hernia is present.  Musculoskeletal:        General: No swelling.     Cervical back: Full passive range of motion without pain, normal range of motion and neck supple. No spinous process tenderness or muscular tenderness. Normal range  of motion.     Right lower leg: No edema.     Left lower leg: No edema.  Skin:    General: Skin is warm and dry.     Capillary Refill: Capillary refill takes less than 2 seconds.     Findings: No ecchymosis, erythema, rash or wound.  Neurological:     General: No focal deficit present.     Mental Status: She is alert and oriented to person, place, and time.     GCS: GCS eye subscore is 4. GCS verbal subscore is 5. GCS motor subscore is 6.     Cranial Nerves: Cranial nerves 2-12 are intact.     Sensory: Sensation is intact.     Motor: Motor function is intact.     Coordination: Coordination is intact.  Psychiatric:        Attention and Perception: Attention normal.        Mood and Affect: Mood normal.        Speech: Speech normal.        Behavior: Behavior normal.     ED  Results / Procedures / Treatments   Labs (all labs ordered are listed, but only abnormal results are displayed) Labs Reviewed - No data to display  EKG None  Radiology No results found.  Procedures Procedures    Medications Ordered in ED Medications  amoxicillin (AMOXIL) capsule 1,000 mg (has no administration in time range)    ED Course/ Medical Decision Making/ A&P                                 Medical Decision Making  Presents with severe dental pain.  Patient previously treated for dental abscess.  Her dentist was unable to get her into fix the tooth and now she is starting to have symptoms again.  Will restart antibiotics, provide analgesia.  She has dental follow-up in a week.        Final Clinical Impression(s) / ED Diagnoses Final diagnoses:  Pain, dental    Rx / DC Orders ED Discharge Orders          Ordered    amoxicillin (AMOXIL) 500 MG capsule  3 times daily        02/25/23 0158    HYDROcodone-acetaminophen (NORCO/VICODIN) 5-325 MG tablet  Every 4 hours PRN        02/25/23 0158              Gilda Crease, MD 02/25/23 [phone]

## 2023-02-25 NOTE — ED Triage Notes (Signed)
Pt c/o dental pain, states she was seen at the dentist a month ago and was told she had an abscess on upper right side. Was given antibiotics and has an appointment to have tooth removed next week but cannot handle the pain.

## 2023-02-25 NOTE — ED Notes (Signed)
Discharge instructions provided by edp were discussed with pt. Pt verbalized understanding with no additional questions at this time.   Dispensed medication pack per EDP instruction. Pt verbalized understanding of pain management. Signature obtained from pt for dispense of medication.

## 2023-02-26 MED FILL — Hydrocodone-Acetaminophen Tab 5-325 MG: ORAL | Qty: 6 | Status: AC
